# Patient Record
Sex: Female | Born: 1995
Health system: Southern US, Community
[De-identification: ages and names within clinical notes are randomized; demographics above are authoritative.]

## PROBLEM LIST (undated history)

## (undated) DIAGNOSIS — F329 Major depressive disorder, single episode, unspecified: Secondary | ICD-10-CM

## (undated) DIAGNOSIS — E119 Type 2 diabetes mellitus without complications: Secondary | ICD-10-CM

## (undated) DIAGNOSIS — F32A Depression, unspecified: Secondary | ICD-10-CM

## (undated) HISTORY — PX: NO PAST SURGERIES: SHX2092

---
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
## 2015-04-25 ENCOUNTER — Encounter: Payer: Self-pay | Admitting: Emergency Medicine

## 2015-04-25 ENCOUNTER — Ambulatory Visit
Admission: EM | Admit: 2015-04-25 | Discharge: 2015-04-25 | Disposition: A | Discharge status: Home / Self Care | Payer: BLUE CROSS/BLUE SHIELD | Attending: Family Medicine | Admitting: Family Medicine

## 2015-04-25 DIAGNOSIS — J029 Acute pharyngitis, unspecified: Secondary | ICD-10-CM | POA: Diagnosis not present

## 2015-04-25 DIAGNOSIS — H6692 Otitis media, unspecified, left ear: Secondary | ICD-10-CM | POA: Diagnosis not present

## 2015-04-25 HISTORY — DX: Major depressive disorder, single episode, unspecified: F32.9

## 2015-04-25 HISTORY — DX: Depression, unspecified: F32.A

## 2015-04-25 LAB — CBC WITH DIFFERENTIAL/PLATELET
Basophils Absolute: 0.1 10*3/uL (ref 0–0.1)
Basophils Relative: 0 %
EOS ABS: 0.6 10*3/uL (ref 0–0.7)
Eosinophils Relative: 4 %
HCT: 42.2 % (ref 35.0–47.0)
HEMOGLOBIN: 14.4 g/dL (ref 12.0–16.0)
LYMPHS ABS: 1.7 10*3/uL (ref 1.0–3.6)
Lymphocytes Relative: 12 %
MCH: 29.3 pg (ref 26.0–34.0)
MCHC: 34.2 g/dL (ref 32.0–36.0)
MCV: 85.6 fL (ref 80.0–100.0)
Monocytes Absolute: 0.9 10*3/uL (ref 0.2–0.9)
Monocytes Relative: 6 %
NEUTROS PCT: 78 %
Neutro Abs: 11.1 10*3/uL — ABNORMAL HIGH (ref 1.4–6.5)
Platelets: 298 10*3/uL (ref 150–440)
RBC: 4.92 MIL/uL (ref 3.80–5.20)
RDW: 12.7 % (ref 11.5–14.5)
WBC: 14.4 10*3/uL — ABNORMAL HIGH (ref 3.6–11.0)

## 2015-04-25 LAB — MONONUCLEOSIS SCREEN: Mono Screen: NEGATIVE

## 2015-04-25 LAB — RAPID STREP SCREEN (MED CTR MEBANE ONLY): Streptococcus, Group A Screen (Direct): NEGATIVE

## 2015-04-25 MED ORDER — AZITHROMYCIN 250 MG PO TABS: ORAL_TABLET | ORAL | Status: DC

## 2015-04-25 NOTE — Discharge Instructions (Signed)
Pharyngitis Pharyngitis is redness, pain, and swelling (inflammation) of your pharynx.  CAUSES  Pharyngitis is usually caused by infection. Most of the time, these infections are from viruses (viral) and are part of a cold. However, sometimes pharyngitis is caused by bacteria (bacterial). Pharyngitis can also be caused by allergies. Viral pharyngitis may be spread from person to person by coughing, sneezing, and personal items or utensils (cups, forks, spoons, toothbrushes). Bacterial pharyngitis may be spread from person to person by more intimate contact, such as kissing.  SIGNS AND SYMPTOMS  Symptoms of pharyngitis include:   Sore throat.   Tiredness (fatigue).   Low-grade fever.   Headache.  Joint pain and muscle aches.  Skin rashes.  Swollen lymph nodes.  Plaque-like film on throat or tonsils (often seen with bacterial pharyngitis). DIAGNOSIS  Your health care provider will ask you questions about your illness and your symptoms. Your medical history, along with a physical exam, is often all that is needed to diagnose pharyngitis. Sometimes, a rapid strep test is done. Other lab tests may also be done, depending on the suspected cause.  TREATMENT  Viral pharyngitis will usually get better in 3-4 days without the use of medicine. Bacterial pharyngitis is treated with medicines that kill germs (antibiotics).  HOME CARE INSTRUCTIONS   Drink enough water and fluids to keep your urine clear or pale yellow.   Only take over-the-counter or prescription medicines as directed by your health care provider:   If you are prescribed antibiotics, make sure you finish them even if you start to feel better.   Do not take aspirin.   Get lots of rest.   Gargle with 8 oz of salt water ( tsp of salt per 1 qt of water) as often as every 1-2 hours to soothe your throat.   Throat lozenges (if you are not at risk for choking) or sprays may be used to soothe your throat. SEEK MEDICAL  CARE IF:   You have large, tender lumps in your neck.  You have a rash.  You cough up green, yellow-brown, or bloody spit. SEEK IMMEDIATE MEDICAL CARE IF:   Your neck becomes stiff.  You drool or are unable to swallow liquids.  You vomit or are unable to keep medicines or liquids down.  You have severe pain that does not go away with the use of recommended medicines.  You have trouble breathing (not caused by a stuffy nose). MAKE SURE YOU:   Understand these instructions.  Will watch your condition.  Will get help right away if you are not doing well or get worse.   This information is not intended to replace advice given to you by your health care provider. Make sure you discuss any questions you have with your health care provider.   Document Released: 04/13/2005 Document Revised: 02/01/2013 Document Reviewed: 12/19/2012 Elsevier Interactive Patient Education 2016 [employer]  Otitis Media, Adult Otitis media is redness, soreness, and puffiness (swelling) in the space just behind your eardrum (middle ear). It may be caused by allergies or infection. It often happens along with a cold. HOME CARE  Take your medicine as told. Finish it even if you start to feel better.  Only take over-the-counter or prescription medicines for pain, discomfort, or fever as told by your doctor.  Follow up with your doctor as told. GET HELP IF:  You have otitis media only in one ear, or bleeding from your nose, or both.  You notice a lump on your neck.  You are not getting better in 3-5 days.  You feel worse instead of better. GET HELP RIGHT AWAY IF:   You have pain that is not helped with medicine.  You have puffiness, redness, or pain around your ear.  You get a stiff neck.  You cannot move part of your face (paralysis).  You notice that the bone behind your ear hurts when you touch it. MAKE SURE YOU:   Understand these instructions.  Will watch your condition.  Will  get help right away if you are not doing well or get worse.   This information is not intended to replace advice given to you by your health care provider. Make sure you discuss any questions you have with your health care provider.   Document Released: 09/30/2007 Document Revised: 05/04/2014 Document Reviewed: 11/08/2012 Elsevier Interactive Patient Education [employer]

## 2015-04-25 NOTE — ED Notes (Signed)
Patient c/o sore throat and ear pain that has not improved.  Patient's last dose of Penicillin is today.

## 2015-04-25 NOTE — ED Provider Notes (Signed)
[facility]  ____________________________________________  Time seen: Approximately 10:00 AM  I have reviewed the triage vital signs and the nursing notes.   HISTORY  Chief Complaint Otalgia and Sore Throat    HPI Lynford HumphreyDanica Alameda is a 19 y.o. female presents with complaint of sore throat. Patient reports that last week she was seen by her primary care doctor and was diagnosed with strep throat with a positive quick strep. Patient reports a that time she was given a prescription for oral penicillin. Patient reports that she took her last penicillin dose this morning. Patient presented last 2-3 days she has began having increasing sore throat even loss of taking medications as prescribed. Patient does also report some runny nose and congestion which is described as mild. Patient also reports left ear pain which is described as an aching throbbing pain at 4 out of 10.  Patient reports that this past Sunday she was around her family and reports that the baby that she was around had strep throat.  Patient reports that for the last 2 days her fever also has returned. Reports that she had a fever last night of 102 and had a fever of 101 this morning. Patient reports that she has taken ibuprofen today.  Denies other sick exposures.  Denies chest pain, shortness of breath, cough, abdominal pain, nausea, vomiting, weakness, dizziness, neck or back pain, rash. Reports sore throat does hurt to swallow however reports continues to eat and drink well.   Past Medical History  Diagnosis Date  . Depression     There are no active problems to display for this patient.  Last menstrual: Was in October. Patient reports that she is on a 3 month birth control regimen (12 weeks pill, then menstrual x one week) Reports she is not sexually active at this time.  History reviewed. No pertinent past surgical history.  Current Outpatient Rx  Name  Route  Sig  Dispense  Refill  . escitalopram  (LEXAPRO) 5 MG tablet   Oral   Take 15 mg by mouth daily.         . norethindrone-ethinyl estradiol-iron (ESTROSTEP FE,TILIA FE,TRI-LEGEST FE) 1-20/1-30/1-35 MG-MCG tablet   Oral   Take 1 tablet by mouth daily.           Allergies Review of patient's allergies indicates no known allergies.   family history Mother: depression  Social History Social History  Substance Use Topics  . Smoking status: Current Some Day Smoker    Types: Cigarettes  . Smokeless tobacco: None  . Alcohol Use: No    Review of Systems Constitutional:  positive subjective fevers. Eyes: No visual changes. ENT:  positive sore throat. Positive runny nose and nasal congestion. Positive left ear pain. ardiovascular: Denies chest pain. Respiratory: Denies shortness of breath. Gastrointestinal: No abdominal pain.  No nausea, no vomiting.  No diarrhea.  No constipation. Genitourinary: Negative for dysuria. Musculoskeletal: Negative for back pain. Skin: Negative for rash. Neurological: Negative for headaches, focal weakness or numbness.  10-point ROS otherwise negative.  ____________________________________________   PHYSICAL EXAM:  VITAL SIGNS: ED Triage Vitals  Enc Vitals Group     BP 04/25/15 0914 129/84 mmHg     Pulse Rate 04/25/15 0914 106 Recheck 94     Resp 04/25/15 0914 16     Temp 04/25/15 0914 97.7 F (36.5 C)     Temp Source 04/25/15 0914 Tympanic     SpO2 04/25/15 0914 100 %     Weight 04/25/15 0914  215 lb (97.523 kg)     Height 04/25/15 0914  (1.676 m)     Head Cir --      Peak Flow --      Pain Score 04/25/15 0918 7     Pain Loc --      Pain Edu? --      Excl. in GC? --     Constitutional: Alert and oriented. Well appearing and in no acute distress. Eyes: Conjunctivae are normal. PERRL. EOMI. Head: Atraumatic. no tenderness to palpation. No swelling. No TMJ tenderness.   Ears:  Left: Moderate erythema, mild tenderness to palpation with auricle movement, TM dullness,  no exudate or drainage. Right: No erythema, normal TM.  Nose:  mild nasal congestion and mild clear rhinorrhea.  Mouth/Throat: Mucous membranes are moist.   Moderate pharyngeal erythema with 2+ bilateral tonsillar swelling. No exudate. No uvular shift or deviation. Neck: No stridor.  No cervical spine tenderness to palpation. Hematological/Lymphatic/Immunilogical:  mild anterior  cervical lymphadenopathy. Cardiovascular: Normal rate, regular rhythm. Grossly normal heart sounds.  Good peripheral circulation. Respiratory: Normal respiratory effort.  No retractions. Lungs CTAB. Gastrointestinal: Soft and nontender. . No hepatomegaly or splenomegaly palpated. Musculoskeletal: No lower or upper extremity tenderness nor edema.  No joint effusions. Bilateral pedal pulses equal and easily palpated.  Neurologic:  Normal speech and language. No gross focal neurologic deficits are appreciated. No gait instability. Skin:  Skin is warm, dry and intact. No rash noted. Psychiatric: Mood and affect are normal. Speech and behavior are normal.  ____________________________________________   LABS (all labs ordered are listed, but only abnormal results are displayed)  Labs Reviewed  CBC WITH DIFFERENTIAL/PLATELET - Abnormal; Notable for the following:    WBC 14.4 (*)    Neutro Abs 11.1 (*)    All other components within normal limits  RAPID STREP SCREEN (NOT AT [facility])  CULTURE, GROUP A STREP ([facility] ONLY)  MONONUCLEOSIS SCREEN     INITIAL IMPRESSION / ASSESSMENT AND PLAN / ED COURSE  Pertinent labs & imaging results that were available during my care of the patient were reviewed by me and considered in my medical decision making (see chart for details).  Very well-appearing patient. No acute distress. Presents for the complaint of sore throat and left ear pain. Patient reports diagnosis strep throat at PCP last week and that she was treated with penicillin. Reports last dose of penicillin was this  morning. Patient presented last 2-3 days sore throat has increased as well as fever has returned. Reports left ear pain times few days. Also reports some accompanying runny nose and nasal congestion. Moderate pharyngeal erythema and tonsillar swelling. Left otitis media. Lungs clear throughout. Abdomen soft and nontender. Will evaluate strep swab as well as evaluate for mono. Concern for repeat strep throat as patient has been back around family members that also positive strep throat.  Labs reviewed. Quick strep negative, will culture. Monitor negative. WBC 14.4. Suspect streptococcal pharyngitis has recently reexposed by family member as well as left otitis media. As patient just taking penicillin will treat with azithromycin. Encouraged rest, fluids, Tylenol or ibuprofen over the counter as needed, rest and PCP follow up.  Discussed follow up with Primary care physician this week. Discussed follow up and return parameters including no resolution or any worsening concerns. Patient verbalized understanding and agreed to plan.   ____________________________________________   FINAL CLINICAL IMPRESSION(S) / ED DIAGNOSES  Final diagnoses:  Pharyngitis  Acute left otitis media, recurrence not specified, unspecified otitis  media type       Renford Dills, NP 04/25/15 1119

## 2015-04-27 LAB — CULTURE, GROUP A STREP (THRC)

## 2019-12-13 DIAGNOSIS — Z0000 Encounter for general adult medical examination without abnormal findings: Secondary | ICD-10-CM | POA: Diagnosis not present

## 2019-12-13 DIAGNOSIS — E559 Vitamin D deficiency, unspecified: Secondary | ICD-10-CM | POA: Diagnosis not present

## 2019-12-13 DIAGNOSIS — Z1159 Encounter for screening for other viral diseases: Secondary | ICD-10-CM | POA: Diagnosis not present

## 2019-12-13 DIAGNOSIS — Z131 Encounter for screening for diabetes mellitus: Secondary | ICD-10-CM | POA: Diagnosis not present

## 2019-12-13 DIAGNOSIS — E538 Deficiency of other specified B group vitamins: Secondary | ICD-10-CM | POA: Diagnosis not present

## 2019-12-13 DIAGNOSIS — N979 Female infertility, unspecified: Secondary | ICD-10-CM | POA: Diagnosis not present

## 2019-12-13 DIAGNOSIS — Z7689 Persons encountering health services in other specified circumstances: Secondary | ICD-10-CM | POA: Diagnosis not present

## 2019-12-13 DIAGNOSIS — Z13220 Encounter for screening for lipoid disorders: Secondary | ICD-10-CM | POA: Diagnosis not present

## 2019-12-13 DIAGNOSIS — Z114 Encounter for screening for human immunodeficiency virus [HIV]: Secondary | ICD-10-CM | POA: Diagnosis not present

## 2019-12-13 DIAGNOSIS — Z23 Encounter for immunization: Secondary | ICD-10-CM | POA: Diagnosis not present

## 2019-12-13 DIAGNOSIS — Z1329 Encounter for screening for other suspected endocrine disorder: Secondary | ICD-10-CM | POA: Diagnosis not present

## 2019-12-13 DIAGNOSIS — R69 Illness, unspecified: Secondary | ICD-10-CM | POA: Diagnosis not present

## 2020-01-03 DIAGNOSIS — R69 Illness, unspecified: Secondary | ICD-10-CM | POA: Diagnosis not present

## 2020-01-03 DIAGNOSIS — N913 Primary oligomenorrhea: Secondary | ICD-10-CM | POA: Diagnosis not present

## 2020-01-03 DIAGNOSIS — N979 Female infertility, unspecified: Secondary | ICD-10-CM | POA: Diagnosis not present

## 2020-01-03 DIAGNOSIS — Z113 Encounter for screening for infections with a predominantly sexual mode of transmission: Secondary | ICD-10-CM | POA: Diagnosis not present

## 2020-01-04 DIAGNOSIS — Z6841 Body Mass Index (BMI) 40.0 and over, adult: Secondary | ICD-10-CM | POA: Diagnosis not present

## 2020-01-04 DIAGNOSIS — N979 Female infertility, unspecified: Secondary | ICD-10-CM | POA: Diagnosis not present

## 2020-01-24 DIAGNOSIS — R69 Illness, unspecified: Secondary | ICD-10-CM | POA: Diagnosis not present

## 2020-02-01 DIAGNOSIS — N979 Female infertility, unspecified: Secondary | ICD-10-CM | POA: Diagnosis not present

## 2020-02-15 DIAGNOSIS — N979 Female infertility, unspecified: Secondary | ICD-10-CM | POA: Diagnosis not present

## 2020-02-16 DIAGNOSIS — Z6841 Body Mass Index (BMI) 40.0 and over, adult: Secondary | ICD-10-CM | POA: Diagnosis not present

## 2020-03-01 ENCOUNTER — Other Ambulatory Visit: Payer: Self-pay

## 2020-03-01 ENCOUNTER — Ambulatory Visit
Admission: EM | Admit: 2020-03-01 | Discharge: 2020-03-01 | Disposition: A | Discharge status: Home / Self Care | Payer: 59 | Attending: Emergency Medicine | Admitting: Emergency Medicine

## 2020-03-01 DIAGNOSIS — J039 Acute tonsillitis, unspecified: Secondary | ICD-10-CM

## 2020-03-01 HISTORY — DX: Type 2 diabetes mellitus without complications: E11.9

## 2020-03-01 LAB — GROUP A STREP BY PCR: Group A Strep by PCR: NOT DETECTED

## 2020-03-01 MED ORDER — AMOXICILLIN-POT CLAVULANATE 875-125 MG PO TABS: 1.0000 | ORAL_TABLET | Freq: Two times a day (BID) | ORAL | 0 refills | Status: DC

## 2020-03-01 NOTE — ED Triage Notes (Signed)
Patient complains of fever, cough,, nasal congestion and sore throat x Monday.

## 2020-03-01 NOTE — Discharge Instructions (Signed)
Take the Augmentin twice daily with food for 7 days.  Gargle with warm salt water 2-3 times a day.  Put 1 tablespoon of table salt in 8 ounces of warm water gargle and spit.  Take an over-the-counter probiotic 1 hour after each dose of antibiotic, such as Culturelle, to prevent diarrhea or yeast infection.  If your symptoms not improve follow-up with your primary care provider.

## 2020-03-01 NOTE — ED Provider Notes (Signed)
[facility]    CSN: [id number] Arrival date & time: 03/01/20  1013      History   Chief Complaint Chief Complaint  Patient presents with  . Fever  . Sore Throat    HPI Victoria Sutton is a 24 y.o. female.   24 year old female here for evaluation of fever, cough, nasal congestion, and sore throat.  Patient reports that her symptoms started 4 days ago.  She has had a max temperature at home of 101.  She does report that she had a coworker test positive for Covid 2 weeks ago but she was not around them and when at work she wears a mask.  She denies any ear pain or pressure just says that they pop when she yawns.  Patient denies body aches, changes to taste or smell, nausea, vomiting, or diarrhea.  She is had no pain or pressure in her sinuses.     Past Medical History:  Diagnosis Date  . Depression   . Diabetes mellitus without complication (HCC)     There are no problems to display for this patient.   Past Surgical History:  Procedure Laterality Date  . NO PAST SURGERIES      OB History   No obstetric history on file.      Home Medications    Prior to Admission medications   Medication Sig Start Date End Date Taking? Authorizing Provider  metFORMIN (GLUCOPHAGE) 500 MG tablet Take by mouth. 02/05/20  Yes [provider]  phentermine (ADIPEX-P) 37.5 MG tablet Take 37.5 mg by mouth at bedtime. 02/17/20  Yes [provider]  traZODone (DESYREL) 50 MG tablet Take 50 mg by mouth at bedtime. 02/16/20  Yes [provider]  TRINTELLIX 20 MG TABS tablet Take 20 mg by mouth daily. 02/25/20  Yes [provider]  amoxicillin-clavulanate (AUGMENTIN) 875-125 MG tablet Take 1 tablet by mouth every 12 (twelve) hours. 03/01/20   Becky Augusta, NP  escitalopram (LEXAPRO) 5 MG tablet Take 15 mg by mouth daily.  03/01/20  [provider]  norethindrone-ethinyl estradiol-iron (ESTROSTEP FE,TILIA FE,TRI-LEGEST FE) 1-20/1-30/1-35  MG-MCG tablet Take 1 tablet by mouth daily.  03/01/20  [provider]    Family History Family History  Problem Relation Age of Onset  . Diabetes Mother   . Hypertension Mother   . Diabetes Father   . Hyperlipidemia Father   . Hypertension Father     Social History Social History   Tobacco Use  . Smoking status: Current Some Day Smoker    Types: Cigarettes  . Smokeless tobacco: Never Used  Vaping Use  . Vaping Use: Never used  Substance Use Topics  . Alcohol use: No  . Drug use: No     Allergies   Patient has no known allergies.   Review of Systems Review of Systems  Constitutional: Positive for fever. Negative for activity change.  HENT: Positive for congestion and sore throat. Negative for ear discharge, ear pain, sinus pressure and sinus pain.   Respiratory: Positive for cough. Negative for shortness of breath and wheezing.   Cardiovascular: Negative for chest pain.  Gastrointestinal: Negative for diarrhea, nausea and vomiting.  Genitourinary: Negative for frequency and urgency.  Musculoskeletal: Negative for arthralgias and myalgias.  Skin: Negative for rash.  Neurological: Negative for headaches.  Hematological: Negative.   Psychiatric/Behavioral: Negative.      Physical Exam Triage Vital Signs ED Triage Vitals  Enc Vitals Group     BP 03/01/20 1035 123/73  Pulse Rate 03/01/20 1035 80     Resp 03/01/20 1035 17     Temp 03/01/20 1035 98.2 F (36.8 C)     Temp Source 03/01/20 1035 Oral     SpO2 03/01/20 1035 100 %     Weight 03/01/20 1032 277 lb (125.6 kg)     Height 03/01/20 1032 5\' 5"  (1.651 m)     Head Circumference --      Peak Flow --      Pain Score 03/01/20 1032 4     Pain Loc --      Pain Edu? --      Excl. in GC? --    No data found.  Updated Vital Signs BP 123/73 (BP Location: Left Arm)   Pulse 80   Temp 98.2 F (36.8 C) (Oral)   Resp 17   Ht 5\' 5"  (1.651 m)   Wt 277 lb (125.6 kg)   LMP 02/05/2020   SpO2 100%    BMI 46.10 kg/m   Visual Acuity Right Eye Distance:   Left Eye Distance:   Bilateral Distance:    Right Eye Near:   Left Eye Near:    Bilateral Near:     Physical Exam Vitals and nursing note reviewed.  Constitutional:      General: She is not in acute distress.    Appearance: She is well-developed. She is not toxic-appearing.  HENT:     Head: Normocephalic and atraumatic.     Right Ear: Tympanic membrane and ear canal normal. No middle ear effusion. Tympanic membrane is not erythematous.     Left Ear: Tympanic membrane and ear canal normal.  No middle ear effusion. Tympanic membrane is not erythematous.     Nose: Congestion and rhinorrhea present.     Comments: Nasal mucosa is mildly erythematous and edematous with clear nasal discharge.  No tenderness to percussion to either the frontal or maxillary sinuses.    Mouth/Throat:     Mouth: Mucous membranes are moist.     Tonsils: Tonsillar exudate present. 2+ on the right. 2+ on the left.     Comments: There is a strep odor on the patient's breath.  Tonsillar pillars are erythematous and edematous with white exudate bilaterally.  Unable to visualize the posterior oropharynx. Eyes:     Extraocular Movements:     Right eye: Normal extraocular motion.     Left eye: Normal extraocular motion.     Conjunctiva/sclera: Conjunctivae normal.     Pupils: Pupils are equal, round, and reactive to light.  Neck:     Comments: Patient has bilateral shotty, nontender, anterior cervical lymphadenopathy. Cardiovascular:     Rate and Rhythm: Normal rate and regular rhythm.     Heart sounds: Normal heart sounds. No murmur heard.  No gallop.   Pulmonary:     Effort: Pulmonary effort is normal.     Breath sounds: Normal breath sounds. No wheezing, rhonchi or rales.  Musculoskeletal:     Cervical back: Normal range of motion and neck supple.  Lymphadenopathy:     Cervical: Cervical adenopathy present.  Skin:    General: Skin is warm and dry.       Capillary Refill: Capillary refill takes less than 2 seconds.     Findings: No erythema or rash.  Neurological:     General: No focal deficit present.     Mental Status: She is alert and oriented to person, place, and time.  Psychiatric:  Mood and Affect: Mood normal.        Behavior: Behavior normal.      UC Treatments / Results  Labs (all labs ordered are listed, but only abnormal results are displayed) Labs Reviewed  GROUP A STREP BY PCR    EKG   Radiology No results found.  Procedures Procedures (including critical care time)  Medications Ordered in UC Medications - No data to display  Initial Impression / Assessment and Plan / UC Course  I have reviewed the triage vital signs and the nursing notes.  Pertinent labs & imaging results that were available during my care of the patient were reviewed by me and considered in my medical decision making (see chart for details).   Patient is here for evaluation of a sore throat and associated cold symptoms including fever and cough.  Patient does have some nasal congestion with swollen and red nasal mucous membranes.  Clear discharge in both nares.  Patient's throat has exudative tonsillitis on both sides.  And there is a strep-like odor on her breath.  Shotty changes of anterior cervical lymphadenopathy are present bilaterally.  Patient also complaining of a cough but her lungs are clear to auscultation.  Suspect postnasal drip might be contributing to the cough but unable to visualize posterior oropharynx due to the tonsillar edema.  Will check strep PCR.  PCR is negative.  However, based upon exam findings will treat patient for exudative tonsillitis with Augmentin twice daily for 7 days.   Final Clinical Impressions(s) / UC Diagnoses   Final diagnoses:  Exudative tonsillitis     Discharge Instructions     Take the Augmentin twice daily with food for 7 days.  Gargle with warm salt water 2-3 times a day.   Put 1 tablespoon of table salt in 8 ounces of warm water gargle and spit.  Take an over-the-counter probiotic 1 hour after each dose of antibiotic, such as Culturelle, to prevent diarrhea or yeast infection.  If your symptoms not improve follow-up with your primary care provider.    ED Prescriptions    Medication Sig Dispense Auth. Provider   amoxicillin-clavulanate (AUGMENTIN) 875-125 MG tablet Take 1 tablet by mouth every 12 (twelve) hours. 14 tablet Becky Augusta, NP     PDMP not reviewed this encounter.   Becky Augusta, NP 03/01/20 [phone]

## 2020-05-09 DIAGNOSIS — Z6841 Body Mass Index (BMI) 40.0 and over, adult: Secondary | ICD-10-CM | POA: Diagnosis not present

## 2020-06-06 DIAGNOSIS — Z6841 Body Mass Index (BMI) 40.0 and over, adult: Secondary | ICD-10-CM | POA: Diagnosis not present

## 2020-07-30 DIAGNOSIS — Z6841 Body Mass Index (BMI) 40.0 and over, adult: Secondary | ICD-10-CM | POA: Diagnosis not present

## 2020-09-03 DIAGNOSIS — Z6841 Body Mass Index (BMI) 40.0 and over, adult: Secondary | ICD-10-CM | POA: Diagnosis not present

## 2020-10-08 DIAGNOSIS — R69 Illness, unspecified: Secondary | ICD-10-CM | POA: Diagnosis not present

## 2020-10-08 DIAGNOSIS — E538 Deficiency of other specified B group vitamins: Secondary | ICD-10-CM | POA: Diagnosis not present

## 2020-10-08 DIAGNOSIS — E559 Vitamin D deficiency, unspecified: Secondary | ICD-10-CM | POA: Diagnosis not present

## 2020-10-22 DIAGNOSIS — R69 Illness, unspecified: Secondary | ICD-10-CM | POA: Diagnosis not present

## 2020-11-05 DIAGNOSIS — R69 Illness, unspecified: Secondary | ICD-10-CM | POA: Diagnosis not present

## 2020-11-06 ENCOUNTER — Ambulatory Visit
Admission: EM | Admit: 2020-11-06 | Discharge: 2020-11-06 | Disposition: A | Discharge status: Home / Self Care | Payer: 59 | Attending: Family Medicine | Admitting: Family Medicine

## 2020-11-06 ENCOUNTER — Other Ambulatory Visit: Payer: Self-pay

## 2020-11-06 DIAGNOSIS — Z7984 Long term (current) use of oral hypoglycemic drugs: Secondary | ICD-10-CM | POA: Diagnosis not present

## 2020-11-06 DIAGNOSIS — R509 Fever, unspecified: Secondary | ICD-10-CM | POA: Insufficient documentation

## 2020-11-06 DIAGNOSIS — J029 Acute pharyngitis, unspecified: Secondary | ICD-10-CM

## 2020-11-06 DIAGNOSIS — Z833 Family history of diabetes mellitus: Secondary | ICD-10-CM | POA: Diagnosis not present

## 2020-11-06 DIAGNOSIS — Z20822 Contact with and (suspected) exposure to covid-19: Secondary | ICD-10-CM | POA: Insufficient documentation

## 2020-11-06 DIAGNOSIS — Z79899 Other long term (current) drug therapy: Secondary | ICD-10-CM | POA: Diagnosis not present

## 2020-11-06 DIAGNOSIS — F17210 Nicotine dependence, cigarettes, uncomplicated: Secondary | ICD-10-CM | POA: Diagnosis not present

## 2020-11-06 DIAGNOSIS — R5383 Other fatigue: Secondary | ICD-10-CM | POA: Diagnosis not present

## 2020-11-06 DIAGNOSIS — E119 Type 2 diabetes mellitus without complications: Secondary | ICD-10-CM | POA: Insufficient documentation

## 2020-11-06 DIAGNOSIS — Z28310 Unvaccinated for covid-19: Secondary | ICD-10-CM | POA: Diagnosis not present

## 2020-11-06 DIAGNOSIS — R69 Illness, unspecified: Secondary | ICD-10-CM | POA: Diagnosis not present

## 2020-11-06 LAB — GROUP A STREP BY PCR: Group A Strep by PCR: NOT DETECTED

## 2020-11-06 MED ORDER — LIDOCAINE VISCOUS HCL 2 % MT SOLN: 15.0000 mL | OROMUCOSAL | 0 refills | Status: AC | PRN

## 2020-11-06 NOTE — Discharge Instructions (Addendum)

## 2020-11-06 NOTE — ED Provider Notes (Signed)
[facility]    CSN: [id number] Arrival date & time: 11/06/20  1317      History   Chief Complaint Chief Complaint  Patient presents with   Fever    HPI Victoria Sutton is a 25 y.o. female presenting for an onset of sore throat, painful swallowing and temperature of 100.7 degrees yesterday.  Just admits to fatigue.  Patient's temperature is currently 99.2 degrees.  She has not been taking any medication for symptoms.  She denies any associated body aches, cough, congestion, sinus pain, chest pain, shortness of breath, nausea/vomiting or diarrhea.  No sick contacts and no known exposure to COVID-19.  Has not been vaccinated for COVID-19.  She does have personal history of diabetes.  No other complaints.  HPI  Past Medical History:  Diagnosis Date   Depression    Diabetes mellitus without complication (HCC)     There are no problems to display for this patient.   Past Surgical History:  Procedure Laterality Date   NO PAST SURGERIES      OB History   No obstetric history on file.      Home Medications    Prior to Admission medications   Medication Sig Start Date End Date Taking? Authorizing Provider  lidocaine (XYLOCAINE) 2 % solution Use as directed 15 mLs in the mouth or throat every 3 (three) hours as needed for up to 5 days for mouth pain (swish and spit). 11/06/20 11/11/20 Yes Shirlee Latch, PA-C  metFORMIN (GLUCOPHAGE) 500 MG tablet Take by mouth. 02/05/20   [provider]  phentermine (ADIPEX-P) 37.5 MG tablet Take 37.5 mg by mouth at bedtime. 02/17/20   [provider]  traZODone (DESYREL) 50 MG tablet Take 50 mg by mouth at bedtime. 02/16/20   [provider]  TRINTELLIX 20 MG TABS tablet Take 20 mg by mouth daily. 02/25/20   [provider]  escitalopram (LEXAPRO) 5 MG tablet Take 15 mg by mouth daily.  03/01/20  [provider]  norethindrone-ethinyl estradiol-iron (ESTROSTEP FE,TILIA FE,TRI-LEGEST FE)  1-20/1-30/1-35 MG-MCG tablet Take 1 tablet by mouth daily.  03/01/20  [provider]    Family History Family History  Problem Relation Age of Onset   Diabetes Mother    Hypertension Mother    Diabetes Father    Hyperlipidemia Father    Hypertension Father     Social History Social History   Tobacco Use   Smoking status: Some Days    Pack years: 0.00    Types: Cigarettes   Smokeless tobacco: Never  Vaping Use   Vaping Use: Never used  Substance Use Topics   Alcohol use: No   Drug use: No     Allergies   Patient has no known allergies.   Review of Systems Review of Systems  Constitutional:  Positive for fatigue and fever. Negative for chills and diaphoresis.  HENT:  Positive for sore throat. Negative for congestion, ear pain, rhinorrhea, sinus pressure and sinus pain.   Respiratory:  Negative for cough and shortness of breath.   Gastrointestinal:  Negative for abdominal pain, nausea and vomiting.  Musculoskeletal:  Negative for arthralgias and myalgias.  Skin:  Negative for rash.  Neurological:  Negative for weakness and headaches.  Hematological:  Negative for adenopathy.    Physical Exam Triage Vital Signs ED Triage Vitals  Enc Vitals Group     BP 11/06/20 1332 123/84     Pulse Rate 11/06/20 1332 (!) 111  Resp 11/06/20 1332 16     Temp 11/06/20 1332 99.2 F (37.3 C)     Temp Source 11/06/20 1332 Oral     SpO2 11/06/20 1332 98 %     Weight 11/06/20 1333 240 lb (108.9 kg)     Height 11/06/20 1333 5\' 5"  (1.651 m)     Head Circumference --      Peak Flow --      Pain Score 11/06/20 1333 6     Pain Loc --      Pain Edu? --      Excl. in GC? --    No data found.  Updated Vital Signs BP 123/84   Pulse (!) 111   Temp 99.2 F (37.3 C) (Oral)   Resp 16   Ht 5\' 5"  (1.651 m)   Wt 240 lb (108.9 kg)   LMP 11/02/2020 (Approximate)   SpO2 98%   BMI 39.94 kg/m       Physical Exam Vitals and nursing note reviewed.  Constitutional:       General: She is not in acute distress.    Appearance: Normal appearance. She is not ill-appearing or toxic-appearing.  HENT:     Head: Normocephalic and atraumatic.     Nose: Nose normal.     Mouth/Throat:     Mouth: Mucous membranes are moist.     Pharynx: Oropharynx is clear. Posterior oropharyngeal erythema present.     Tonsils: 1+ on the right. 1+ on the left.  Eyes:     General: No scleral icterus.       Right eye: No discharge.        Left eye: No discharge.     Conjunctiva/sclera: Conjunctivae normal.  Cardiovascular:     Rate and Rhythm: Normal rate and regular rhythm.     Heart sounds: Normal heart sounds.  Pulmonary:     Effort: Pulmonary effort is normal. No respiratory distress.     Breath sounds: Normal breath sounds.  Musculoskeletal:     Cervical back: Neck supple.  Lymphadenopathy:     Cervical: Cervical adenopathy present.  Skin:    General: Skin is dry.  Neurological:     General: No focal deficit present.     Mental Status: She is alert. Mental status is at baseline.     Motor: No weakness.     Gait: Gait normal.  Psychiatric:        Mood and Affect: Mood normal.        Behavior: Behavior normal.        Thought Content: Thought content normal.     UC Treatments / Results  Labs (all labs ordered are listed, but only abnormal results are displayed) Labs Reviewed  GROUP A STREP BY PCR  SARS CORONAVIRUS 2 (TAT 6-24 HRS)    EKG   Radiology No results found.  Procedures Procedures (including critical care time)  Medications Ordered in UC Medications - No data to display  Initial Impression / Assessment and Plan / UC Course  I have reviewed the triage vital signs and the nursing notes.  Pertinent labs & imaging results that were available during my care of the patient were reviewed by me and considered in my medical decision making (see chart for details).  25 year old female presenting for sore throat, painful swallowing and fatigue as well  as low-grade temperature since yesterday.  Exam is significant for 1+ tonsillar swelling with erythema.  Bilateral enlarged anterior cervical lymph nodes.  The remainder  the exam is normal.  PCR strep test is negative.  PCR COVID test obtained.  Current CDC guidelines, isolation protocol and ED precautions reviewed with patient.  Advised patient she has viral tonsillitis/pharyngitis.  Supportive care encouraged with increasing rest and fluids.  Sent in viscous lidocaine.  Also advised ibuprofen and/or Tylenol.  Work note given.  Follow-up with [place] as needed for any worsening symptoms or if she is not feeling better next week.   Final Clinical Impressions(s) / UC Diagnoses   Final diagnoses:  Acute pharyngitis, unspecified etiology  Fatigue, unspecified type     Discharge Instructions      URI/COLD SYMPTOMS: Your exam today is consistent with a viral illness. Antibiotics are not indicated at this time. Use medications as directed, including cough syrup, nasal saline, and decongestants. Your symptoms should improve over the next few days and resolve within 7-10 days. Increase rest and fluids. F/u if symptoms worsen or predominate such as sore throat, ear pain, productive cough, shortness of breath, or if you develop high fevers or worsening fatigue over the next several days.    You have received COVID testing today either for positive exposure, concerning symptoms that could be related to COVID infection, screening purposes, or re-testing after confirmed positive.  Your test obtained today checks for active viral infection in the last 1-2 weeks. If your test is negative now, you can still test positive later. So, if you do develop symptoms you should either get re-tested and/or isolate x 5 days and then strict mask use x 5 days (unvaccinated) or mask use x 10 days (vaccinated). Please follow CDC guidelines.  While Rapid antigen tests come back in 15-20 minutes, send out PCR/molecular test  results typically come back within 1-3 days. In the mean time, if you are symptomatic, assume this could be a positive test and treat/monitor yourself as if you do have COVID.   We will call with test results if positive. Please download the MyChart app and set up a profile to access test results.   If symptomatic, go home and rest. Push fluids. Take Tylenol as needed for discomfort. Gargle warm salt water. Throat lozenges. Take Mucinex DM or Robitussin for cough. Humidifier in bedroom to ease coughing. Warm showers. Also review the COVID handout for more information.  COVID-19 INFECTION: The incubation period of COVID-19 is approximately 14 days after exposure, with most symptoms developing in roughly 4-5 days. Symptoms may range in severity from mild to critically severe. Roughly 80% of those infected will have mild symptoms. People of any age may become infected with COVID-19 and have the ability to transmit the virus. The most common symptoms include: fever, fatigue, cough, body aches, headaches, sore throat, nasal congestion, shortness of breath, nausea, vomiting, diarrhea, changes in smell and/or taste.    COURSE OF ILLNESS Some patients may begin with mild disease which can progress quickly into critical symptoms. If your symptoms are worsening please call ahead to the Emergency Department and proceed there for further treatment. Recovery time appears to be roughly 1-2 weeks for mild symptoms and 3-6 weeks for severe disease.   GO IMMEDIATELY TO ER FOR FEVER YOU ARE UNABLE TO GET DOWN WITH TYLENOL, BREATHING PROBLEMS, CHEST PAIN, FATIGUE, LETHARGY, INABILITY TO EAT OR DRINK, ETC  QUARANTINE AND ISOLATION: To help decrease the spread of COVID-19 please remain isolated if you have COVID infection or are highly suspected to have COVID infection. This means -stay home and isolate to one room in the home  if you live with others. Do not share a bed or bathroom with others while ill, sanitize and wipe  down all countertops and keep common areas clean and disinfected. Stay home for 5 days. If you have no symptoms or your symptoms are resolving after 5 days, you can leave your house. Continue to wear a mask around others for 5 additional days. If you have been in close contact (within 6 feet) of someone diagnosed with COVID 19, you are advised to quarantine in your home for 14 days as symptoms can develop anywhere from 2-14 days after exposure to the virus. If you develop symptoms, you  must isolate.  Most current guidelines for COVID after exposure -unvaccinated: isolate 5 days and strict mask use x 5 days. Test on day 5 is possible -vaccinated: wear mask x 10 days if symptoms do not develop -You do not necessarily need to be tested for COVID if you have + exposure and  develop symptoms. Just isolate at home x10 days from symptom onset During this global pandemic, CDC advises to practice social distancing, try to stay at least 76ft away from others at all times. Wear a face covering. Wash and sanitize your hands regularly and avoid going anywhere that is not necessary.  KEEP IN MIND THAT THE COVID TEST IS NOT 100% ACCURATE AND YOU SHOULD STILL DO EVERYTHING TO PREVENT POTENTIAL SPREAD OF VIRUS TO OTHERS (WEAR MASK, WEAR GLOVES, WASH HANDS AND SANITIZE REGULARLY). IF INITIAL TEST IS NEGATIVE, THIS MAY NOT MEAN YOU ARE DEFINITELY NEGATIVE. MOST ACCURATE TESTING IS DONE 5-7 DAYS AFTER EXPOSURE.   It is not advised by CDC to get re-tested after receiving a positive COVID test since you can still test positive for weeks to months after you have already cleared the virus.   *If you have not been vaccinated for COVID, I strongly suggest you consider getting vaccinated as long as there are no contraindications.       ED Prescriptions     Medication Sig Dispense Auth. Provider   lidocaine (XYLOCAINE) 2 % solution Use as directed 15 mLs in the mouth or throat every 3 (three) hours as needed for up to 5  days for mouth pain (swish and spit). 100 mL Shirlee Latch, PA-C      PDMP not reviewed this encounter.   Shirlee Latch, PA-C 11/06/20 1524

## 2020-11-06 NOTE — ED Triage Notes (Signed)
Pt reports having a fever (100.7) and sore throat that began yesterday. Did not take any fever reducing meds.

## 2020-11-07 LAB — SARS CORONAVIRUS 2 (TAT 6-24 HRS): SARS Coronavirus 2: NEGATIVE

## 2020-11-12 DIAGNOSIS — R69 Illness, unspecified: Secondary | ICD-10-CM | POA: Diagnosis not present

## 2020-11-19 DIAGNOSIS — R69 Illness, unspecified: Secondary | ICD-10-CM | POA: Diagnosis not present

## 2020-11-26 DIAGNOSIS — R69 Illness, unspecified: Secondary | ICD-10-CM | POA: Diagnosis not present

## 2020-11-28 DIAGNOSIS — L03311 Cellulitis of abdominal wall: Secondary | ICD-10-CM | POA: Diagnosis not present

## 2020-12-03 DIAGNOSIS — R69 Illness, unspecified: Secondary | ICD-10-CM | POA: Diagnosis not present

## 2020-12-10 DIAGNOSIS — R69 Illness, unspecified: Secondary | ICD-10-CM | POA: Diagnosis not present

## 2020-12-17 DIAGNOSIS — R69 Illness, unspecified: Secondary | ICD-10-CM | POA: Diagnosis not present

## 2020-12-31 DIAGNOSIS — R69 Illness, unspecified: Secondary | ICD-10-CM | POA: Diagnosis not present

## 2021-01-07 DIAGNOSIS — R69 Illness, unspecified: Secondary | ICD-10-CM | POA: Diagnosis not present

## 2021-01-21 DIAGNOSIS — R69 Illness, unspecified: Secondary | ICD-10-CM | POA: Diagnosis not present

## 2021-02-04 DIAGNOSIS — R69 Illness, unspecified: Secondary | ICD-10-CM | POA: Diagnosis not present

## 2021-02-13 DIAGNOSIS — R69 Illness, unspecified: Secondary | ICD-10-CM | POA: Diagnosis not present

## 2021-02-13 DIAGNOSIS — N912 Amenorrhea, unspecified: Secondary | ICD-10-CM | POA: Diagnosis not present

## 2021-02-13 DIAGNOSIS — N911 Secondary amenorrhea: Secondary | ICD-10-CM | POA: Diagnosis not present

## 2021-02-14 DIAGNOSIS — Z3403 Encounter for supervision of normal first pregnancy, third trimester: Secondary | ICD-10-CM | POA: Insufficient documentation

## 2021-02-20 DIAGNOSIS — R69 Illness, unspecified: Secondary | ICD-10-CM | POA: Diagnosis not present

## 2021-03-04 DIAGNOSIS — R69 Illness, unspecified: Secondary | ICD-10-CM | POA: Diagnosis not present

## 2021-03-18 DIAGNOSIS — R69 Illness, unspecified: Secondary | ICD-10-CM | POA: Diagnosis not present

## 2021-03-25 DIAGNOSIS — Z3401 Encounter for supervision of normal first pregnancy, first trimester: Secondary | ICD-10-CM | POA: Diagnosis not present

## 2021-03-25 DIAGNOSIS — Z6841 Body Mass Index (BMI) 40.0 and over, adult: Secondary | ICD-10-CM | POA: Diagnosis not present

## 2021-03-25 DIAGNOSIS — N898 Other specified noninflammatory disorders of vagina: Secondary | ICD-10-CM | POA: Diagnosis not present

## 2021-03-25 LAB — OB RESULTS CONSOLE ABO/RH: RH Type: NEGATIVE

## 2021-03-25 LAB — OB RESULTS CONSOLE RUBELLA ANTIBODY, IGM: Rubella: NON-IMMUNE/NOT IMMUNE

## 2021-03-25 LAB — OB RESULTS CONSOLE HEPATITIS B SURFACE ANTIGEN: Hepatitis B Surface Ag: NEGATIVE

## 2021-03-25 LAB — OB RESULTS CONSOLE VARICELLA ZOSTER ANTIBODY, IGG: Varicella: NON-IMMUNE/NOT IMMUNE

## 2021-03-25 LAB — OB RESULTS CONSOLE RPR: RPR: NONREACTIVE

## 2021-04-01 DIAGNOSIS — R69 Illness, unspecified: Secondary | ICD-10-CM | POA: Diagnosis not present

## 2021-04-07 DIAGNOSIS — Z3483 Encounter for supervision of other normal pregnancy, third trimester: Secondary | ICD-10-CM | POA: Diagnosis not present

## 2021-04-07 DIAGNOSIS — Z3482 Encounter for supervision of other normal pregnancy, second trimester: Secondary | ICD-10-CM | POA: Diagnosis not present

## 2021-04-08 DIAGNOSIS — Z3402 Encounter for supervision of normal first pregnancy, second trimester: Secondary | ICD-10-CM | POA: Diagnosis not present

## 2021-04-15 ENCOUNTER — Inpatient Hospital Stay: Admission: RE | Admit: 2021-04-15 | Payer: 59 | Source: Ambulatory Visit

## 2021-04-15 DIAGNOSIS — R69 Illness, unspecified: Secondary | ICD-10-CM | POA: Diagnosis not present

## 2021-04-22 DIAGNOSIS — Z23 Encounter for immunization: Secondary | ICD-10-CM | POA: Diagnosis not present

## 2021-05-06 DIAGNOSIS — R69 Illness, unspecified: Secondary | ICD-10-CM | POA: Diagnosis not present

## 2021-05-14 DIAGNOSIS — Z3402 Encounter for supervision of normal first pregnancy, second trimester: Secondary | ICD-10-CM | POA: Diagnosis not present

## 2021-05-20 DIAGNOSIS — R69 Illness, unspecified: Secondary | ICD-10-CM | POA: Diagnosis not present

## 2021-06-10 DIAGNOSIS — R69 Illness, unspecified: Secondary | ICD-10-CM | POA: Diagnosis not present

## 2021-06-18 DIAGNOSIS — O99213 Obesity complicating pregnancy, third trimester: Secondary | ICD-10-CM | POA: Insufficient documentation

## 2021-06-18 DIAGNOSIS — Z6791 Unspecified blood type, Rh negative: Secondary | ICD-10-CM | POA: Insufficient documentation

## 2021-06-18 DIAGNOSIS — O26892 Other specified pregnancy related conditions, second trimester: Secondary | ICD-10-CM | POA: Insufficient documentation

## 2021-07-01 ENCOUNTER — Other Ambulatory Visit
Admission: RE | Admit: 2021-07-01 | Discharge: 2021-07-01 | Disposition: A | Discharge status: Home / Self Care | Payer: 59 | Source: Ambulatory Visit | Attending: Anesthesiology | Admitting: Anesthesiology

## 2021-07-01 ENCOUNTER — Other Ambulatory Visit: Payer: Self-pay

## 2021-07-01 NOTE — Consult Note (Addendum)
Anesthesiology consult note ( Ambulatory referral to OB anesthesia for morbid obesity) : ? ?I had the distinct pleasure of meeting Victoria Sutton today for an OB anesthesia precheck.  She has a history of morbid obesity with a BMI of 42 today.  Her EDD is September 25 2021.  Patient reports no previous problems with anesthesia, no problems with her back and has a MP score of 3.  We discussed the risks of Obesity in Pregnancy including but not limited to: ?Patients with a BMI > 40 are at increased risk for complications during pregnancy, such as hypertensive disorders of pregnancy, gestational diabetes, obstructive sleep apnea, thromboembolic disease, prolonged labor, operative vaginal delivery and need for cesarean delivery. ?There is an increased risk of airway complications, including inability to place a breathing tube, should an emergency cesarean delivery be required. ?There is an increased risk of aspiration, where stomach contents get into the lungs and can cause inflammation, infection and even respiratory failure. ?Epidural placement is more difficult in obesity, often requires multiple attempts, more often results in inadequate pain relief, more often requires replacement due to movement of the epidural catheter and more often results in accidental dural puncture and post dural puncture headache. ? ?I explained to the patient that we would follow our Obesity in Pregnancy Protocol: ?An anesthesiology consultation is recommended for all patients with a pre-pregnancy BMI ? 45. ?During this consultation the anesthesiologist will ask you questions about your medical history as well as do a physical exam that specifically looks for features that are predictive of complications of anesthesia.  ?The anesthesiologist will also review with you potential complications of anesthesia that are specifically increased due to obesity during pregnancy.  ?If your BMI ? 49 at your 34 week appointment you will have to be evaluated by  an anesthesiologist prior to 36 weeks. ?This evaluation will specifically determine whether you are at high risk for complications of anesthesia. ?If you are found to be at high risk for complications of anesthesia your OB provider will be directed to transfer your care to an OB provider at a hospital that has a higher maternal level of care designation ? ?Patient voiced understanding acknowledging that we had discussed her pathway forward. ? ?

## 2021-07-15 DIAGNOSIS — R69 Illness, unspecified: Secondary | ICD-10-CM | POA: Diagnosis not present

## 2021-07-16 DIAGNOSIS — Z6791 Unspecified blood type, Rh negative: Secondary | ICD-10-CM | POA: Diagnosis not present

## 2021-07-16 DIAGNOSIS — O99212 Obesity complicating pregnancy, second trimester: Secondary | ICD-10-CM | POA: Diagnosis not present

## 2021-07-16 DIAGNOSIS — O26892 Other specified pregnancy related conditions, second trimester: Secondary | ICD-10-CM | POA: Diagnosis not present

## 2021-07-16 DIAGNOSIS — Z3403 Encounter for supervision of normal first pregnancy, third trimester: Secondary | ICD-10-CM | POA: Diagnosis not present

## 2021-08-13 DIAGNOSIS — O99213 Obesity complicating pregnancy, third trimester: Secondary | ICD-10-CM | POA: Diagnosis not present

## 2021-08-20 DIAGNOSIS — O99213 Obesity complicating pregnancy, third trimester: Secondary | ICD-10-CM | POA: Diagnosis not present

## 2021-08-27 DIAGNOSIS — N898 Other specified noninflammatory disorders of vagina: Secondary | ICD-10-CM | POA: Diagnosis not present

## 2021-08-27 DIAGNOSIS — Z23 Encounter for immunization: Secondary | ICD-10-CM | POA: Diagnosis not present

## 2021-08-27 DIAGNOSIS — O99213 Obesity complicating pregnancy, third trimester: Secondary | ICD-10-CM | POA: Diagnosis not present

## 2021-08-27 LAB — OB RESULTS CONSOLE GBS: GBS: NEGATIVE

## 2021-09-03 DIAGNOSIS — R69 Illness, unspecified: Secondary | ICD-10-CM | POA: Diagnosis not present

## 2021-09-03 DIAGNOSIS — R030 Elevated blood-pressure reading, without diagnosis of hypertension: Secondary | ICD-10-CM | POA: Diagnosis not present

## 2021-09-03 DIAGNOSIS — O26843 Uterine size-date discrepancy, third trimester: Secondary | ICD-10-CM | POA: Diagnosis not present

## 2021-09-03 DIAGNOSIS — Z113 Encounter for screening for infections with a predominantly sexual mode of transmission: Secondary | ICD-10-CM | POA: Diagnosis not present

## 2021-09-03 DIAGNOSIS — O09899 Supervision of other high risk pregnancies, unspecified trimester: Secondary | ICD-10-CM | POA: Insufficient documentation

## 2021-09-03 DIAGNOSIS — O99213 Obesity complicating pregnancy, third trimester: Secondary | ICD-10-CM | POA: Diagnosis not present

## 2021-09-03 DIAGNOSIS — O99891 Other specified diseases and conditions complicating pregnancy: Secondary | ICD-10-CM | POA: Diagnosis not present

## 2021-09-03 DIAGNOSIS — Z3403 Encounter for supervision of normal first pregnancy, third trimester: Secondary | ICD-10-CM | POA: Diagnosis not present

## 2021-09-03 LAB — OB RESULTS CONSOLE HIV ANTIBODY (ROUTINE TESTING): HIV: NONREACTIVE

## 2021-09-10 DIAGNOSIS — O99213 Obesity complicating pregnancy, third trimester: Secondary | ICD-10-CM | POA: Diagnosis not present

## 2021-09-10 DIAGNOSIS — O09893 Supervision of other high risk pregnancies, third trimester: Secondary | ICD-10-CM | POA: Diagnosis not present

## 2021-09-17 DIAGNOSIS — O09899 Supervision of other high risk pregnancies, unspecified trimester: Secondary | ICD-10-CM | POA: Diagnosis not present

## 2021-09-17 DIAGNOSIS — O99213 Obesity complicating pregnancy, third trimester: Secondary | ICD-10-CM | POA: Diagnosis not present

## 2021-09-23 ENCOUNTER — Other Ambulatory Visit: Payer: Self-pay | Admitting: Obstetrics and Gynecology

## 2021-09-23 DIAGNOSIS — Z3490 Encounter for supervision of normal pregnancy, unspecified, unspecified trimester: Secondary | ICD-10-CM

## 2021-09-23 NOTE — Progress Notes (Signed)
Victoria Sutton is a 26 y.o. G1P0 female at [redacted]w[redacted]d dated by LMP.  She presents to L&D for IOL for obesity.  Pregnancy Issues: 1. H/o mental health diagnoses 2. BMI 40 or more - Obesity Class III  3. Varicella and rubella non Immune 4. Rh Negative   Prenatal care site: [facility]  EFW: 09/03/21: 6lb14oz(3124g)=76%   Pertinent Results:  Prenatal Labs: Blood type/Rh A neg  Antibody screen neg  Rubella Non-Immune  Varicella Non-Immune  RPR NR  HBsAg Neg  HIV NR  GC neg  Chlamydia neg  Genetic screening negative  1 hour GTT 122  3 hour GTT   GBS neg    5. Post Partum Planning: - Infant feeding: expressed breast and formula  - Contraception: POPs - Tdap given 08/27/21 - Flu given 04/22/21  Victoria Sutton, CNM 09/23/2021 6:16 PM

## 2021-09-26 ENCOUNTER — Inpatient Hospital Stay: Payer: 59 | Admitting: Anesthesiology

## 2021-09-26 ENCOUNTER — Encounter: Payer: Self-pay | Admitting: *Deleted

## 2021-09-26 ENCOUNTER — Encounter
Admission: EM | Disposition: A | Discharge status: Home / Self Care | Payer: Self-pay | Source: Home / Self Care | Attending: Obstetrics and Gynecology

## 2021-09-26 ENCOUNTER — Other Ambulatory Visit: Payer: Self-pay

## 2021-09-26 ENCOUNTER — Inpatient Hospital Stay
Admission: EM | Admit: 2021-09-26 | Discharge: 2021-09-28 | DRG: 787 | Disposition: A | Discharge status: Home / Self Care | Payer: 59 | Attending: Obstetrics and Gynecology | Admitting: Obstetrics and Gynecology

## 2021-09-26 DIAGNOSIS — D62 Acute posthemorrhagic anemia: Secondary | ICD-10-CM | POA: Diagnosis not present

## 2021-09-26 DIAGNOSIS — O99214 Obesity complicating childbirth: Secondary | ICD-10-CM | POA: Diagnosis not present

## 2021-09-26 DIAGNOSIS — O99334 Smoking (tobacco) complicating childbirth: Secondary | ICD-10-CM | POA: Diagnosis present

## 2021-09-26 DIAGNOSIS — F17210 Nicotine dependence, cigarettes, uncomplicated: Secondary | ICD-10-CM | POA: Diagnosis present

## 2021-09-26 DIAGNOSIS — O26893 Other specified pregnancy related conditions, third trimester: Secondary | ICD-10-CM | POA: Diagnosis not present

## 2021-09-26 DIAGNOSIS — O9081 Anemia of the puerperium: Secondary | ICD-10-CM | POA: Diagnosis not present

## 2021-09-26 DIAGNOSIS — Z3A40 40 weeks gestation of pregnancy: Secondary | ICD-10-CM | POA: Diagnosis not present

## 2021-09-26 DIAGNOSIS — R69 Illness, unspecified: Secondary | ICD-10-CM | POA: Diagnosis not present

## 2021-09-26 DIAGNOSIS — G8918 Other acute postprocedural pain: Secondary | ICD-10-CM | POA: Diagnosis not present

## 2021-09-26 DIAGNOSIS — R1084 Generalized abdominal pain: Secondary | ICD-10-CM | POA: Diagnosis not present

## 2021-09-26 DIAGNOSIS — Z3490 Encounter for supervision of normal pregnancy, unspecified, unspecified trimester: Principal | ICD-10-CM | POA: Diagnosis present

## 2021-09-26 DIAGNOSIS — Z6791 Unspecified blood type, Rh negative: Secondary | ICD-10-CM

## 2021-09-26 LAB — COMPREHENSIVE METABOLIC PANEL
ALT: 18 U/L (ref 0–44)
AST: 17 U/L (ref 15–41)
Albumin: 3 g/dL — ABNORMAL LOW (ref 3.5–5.0)
Alkaline Phosphatase: 121 U/L (ref 38–126)
Anion gap: 7 (ref 5–15)
BUN: 11 mg/dL (ref 6–20)
CO2: 22 mmol/L (ref 22–32)
Calcium: 9.2 mg/dL (ref 8.9–10.3)
Chloride: 106 mmol/L (ref 98–111)
Creatinine, Ser: 0.61 mg/dL (ref 0.44–1.00)
GFR, Estimated: >60 mL/min (ref >60)
Glucose, Bld: 90 mg/dL (ref 70–99)
Potassium: 4 mmol/L (ref 3.5–5.1)
Sodium: 135 mmol/L (ref 135–145)
Total Bilirubin: 0.4 mg/dL (ref 0.3–1.2)
Total Protein: 7.1 g/dL (ref 6.5–8.1)

## 2021-09-26 LAB — CBC
HCT: 31.9 % — ABNORMAL LOW (ref 36.0–46.0)
Hemoglobin: 10.5 g/dL — ABNORMAL LOW (ref 12.0–15.0)
MCH: 26.8 pg (ref 26.0–34.0)
MCHC: 32.9 g/dL (ref 30.0–36.0)
MCV: 81.4 fL (ref 80.0–100.0)
Platelets: 246 10*3/uL (ref 150–400)
RBC: 3.92 MIL/uL (ref 3.87–5.11)
RDW: 14.8 % (ref 11.5–15.5)
WBC: 12.3 10*3/uL — ABNORMAL HIGH (ref 4.0–10.5)
nRBC: 0 % (ref 0.0–0.2)

## 2021-09-26 LAB — PROTEIN / CREATININE RATIO, URINE
Creatinine, Urine: 173 mg/dL
Protein Creatinine Ratio: 0.24 mg/mg{Cre} — ABNORMAL HIGH (ref 0.00–0.15)
Total Protein, Urine: 41 mg/dL

## 2021-09-26 LAB — TYPE AND SCREEN
ABO/RH(D): A NEG
Antibody Screen: NEGATIVE

## 2021-09-26 LAB — RPR: RPR Ser Ql: NONREACTIVE

## 2021-09-26 SURGERY — Surgical Case
Anesthesia: Epidural

## 2021-09-26 MED ORDER — MISOPROSTOL 50MCG HALF TABLET
50.0000 ug | ORAL_TABLET | ORAL | Status: DC | PRN
  Administered 2021-09-26: 50 ug via ORAL
  Filled 2021-09-26: qty 1

## 2021-09-26 MED ORDER — SOD CITRATE-CITRIC ACID 500-334 MG/5ML PO SOLN: 30.0000 mL | ORAL | Status: DC | PRN

## 2021-09-26 MED ORDER — ACETAMINOPHEN 500 MG PO TABS
ORAL_TABLET | ORAL | Status: AC
  Filled 2021-09-26: qty 2

## 2021-09-26 MED ORDER — SOD CITRATE-CITRIC ACID 500-334 MG/5ML PO SOLN
30.0000 mL | ORAL | Status: AC
  Administered 2021-09-26: 30 mL via ORAL
  Filled 2021-09-26: qty 30

## 2021-09-26 MED ORDER — ACETAMINOPHEN 325 MG PO TABS: 650.0000 mg | ORAL_TABLET | ORAL | Status: DC | PRN

## 2021-09-26 MED ORDER — KETOROLAC TROMETHAMINE 30 MG/ML IJ SOLN
INTRAMUSCULAR | Status: AC
  Filled 2021-09-26: qty 1

## 2021-09-26 MED ORDER — PHENYLEPHRINE 80 MCG/ML (10ML) SYRINGE FOR IV PUSH (FOR BLOOD PRESSURE SUPPORT): 80.0000 ug | PREFILLED_SYRINGE | INTRAVENOUS | Status: DC | PRN

## 2021-09-26 MED ORDER — MISOPROSTOL 200 MCG PO TABS
ORAL_TABLET | ORAL | Status: AC
  Filled 2021-09-26: qty 4

## 2021-09-26 MED ORDER — LIDOCAINE HCL (PF) 2 % IJ SOLN
INTRAMUSCULAR | Status: DC | PRN
  Administered 2021-09-26: 100 mg via EPIDURAL
  Administered 2021-09-26: 200 mg via EPIDURAL

## 2021-09-26 MED ORDER — LACTATED RINGERS IV SOLN: 500.0000 mL | INTRAVENOUS | Status: DC | PRN

## 2021-09-26 MED ORDER — OXYCODONE-ACETAMINOPHEN 5-325 MG PO TABS: 1.0000 | ORAL_TABLET | ORAL | Status: DC | PRN

## 2021-09-26 MED ORDER — LACTATED RINGERS IV SOLN: INTRAVENOUS | Status: DC

## 2021-09-26 MED ORDER — FENTANYL CITRATE (PF) 100 MCG/2ML IJ SOLN: 50.0000 ug | INTRAMUSCULAR | Status: DC | PRN

## 2021-09-26 MED ORDER — EPHEDRINE 5 MG/ML INJ: 10.0000 mg | INTRAVENOUS | Status: DC | PRN

## 2021-09-26 MED ORDER — LIDOCAINE HCL 2 % IJ SOLN
INTRAMUSCULAR | Status: AC
  Filled 2021-09-26: qty 10

## 2021-09-26 MED ORDER — OXYTOCIN 10 UNIT/ML IJ SOLN
INTRAMUSCULAR | Status: AC
  Filled 2021-09-26: qty 2

## 2021-09-26 MED ORDER — AMMONIA AROMATIC IN INHA
RESPIRATORY_TRACT | Status: AC
  Filled 2021-09-26: qty 10

## 2021-09-26 MED ORDER — SODIUM CHLORIDE 0.9 % IV SOLN
INTRAVENOUS | Status: AC
  Filled 2021-09-26: qty 5

## 2021-09-26 MED ORDER — DIPHENHYDRAMINE HCL 50 MG/ML IJ SOLN: 12.5000 mg | INTRAMUSCULAR | Status: DC | PRN

## 2021-09-26 MED ORDER — OXYCODONE-ACETAMINOPHEN 5-325 MG PO TABS: 2.0000 | ORAL_TABLET | ORAL | Status: DC | PRN

## 2021-09-26 MED ORDER — BUTORPHANOL TARTRATE 2 MG/ML IJ SOLN: 1.0000 mg | INTRAMUSCULAR | Status: DC | PRN

## 2021-09-26 MED ORDER — PHENYLEPHRINE 80 MCG/ML (10ML) SYRINGE FOR IV PUSH (FOR BLOOD PRESSURE SUPPORT)
80.0000 ug | PREFILLED_SYRINGE | INTRAVENOUS | Status: DC | PRN
Start: 2021-09-26 — End: 2021-09-26

## 2021-09-26 MED ORDER — LIDOCAINE HCL (PF) 1 % IJ SOLN: 30.0000 mL | INTRAMUSCULAR | Status: DC | PRN

## 2021-09-26 MED ORDER — KETOROLAC TROMETHAMINE 30 MG/ML IJ SOLN
INTRAMUSCULAR | Status: DC | PRN
  Administered 2021-09-26: 30 mg via INTRAVENOUS

## 2021-09-26 MED ORDER — PROPOFOL 10 MG/ML IV BOLUS
INTRAVENOUS | Status: AC
  Filled 2021-09-26: qty 20

## 2021-09-26 MED ORDER — ONDANSETRON HCL 4 MG/2ML IJ SOLN: 4.0000 mg | Freq: Four times a day (QID) | INTRAMUSCULAR | Status: DC | PRN

## 2021-09-26 MED ORDER — FENTANYL-BUPIVACAINE-NACL 0.5-0.125-0.9 MG/250ML-% EP SOLN
EPIDURAL | Status: AC
  Filled 2021-09-26: qty 250

## 2021-09-26 MED ORDER — BUPIVACAINE HCL (PF) 0.25 % IJ SOLN
INTRAMUSCULAR | Status: DC | PRN
  Administered 2021-09-26 (×2): 4 mL via EPIDURAL

## 2021-09-26 MED ORDER — LIDOCAINE 2% (20 MG/ML) 5 ML SYRINGE: INTRAMUSCULAR | Status: DC | PRN

## 2021-09-26 MED ORDER — MORPHINE SULFATE (PF) 0.5 MG/ML IJ SOLN
INTRAMUSCULAR | Status: AC
  Filled 2021-09-26: qty 10

## 2021-09-26 MED ORDER — MISOPROSTOL 25 MCG QUARTER TABLET
25.0000 ug | ORAL_TABLET | ORAL | Status: DC | PRN
  Administered 2021-09-26 (×2): 25 ug via VAGINAL
  Filled 2021-09-26 (×2): qty 1

## 2021-09-26 MED ORDER — MORPHINE SULFATE (PF) 0.5 MG/ML IJ SOLN
INTRAMUSCULAR | Status: DC | PRN
  Administered 2021-09-26: 4 mg via EPIDURAL

## 2021-09-26 MED ORDER — OXYTOCIN-SODIUM CHLORIDE 30-0.9 UT/500ML-% IV SOLN
1.0000 m[IU]/min | INTRAVENOUS | Status: DC
  Administered 2021-09-26: 2 m[IU]/min via INTRAVENOUS
  Filled 2021-09-26: qty 500

## 2021-09-26 MED ORDER — SODIUM CHLORIDE 0.9% FLUSH: 20.0000 mL | Freq: Once | INTRAVENOUS | Status: DC

## 2021-09-26 MED ORDER — FENTANYL CITRATE (PF) 100 MCG/2ML IJ SOLN
INTRAMUSCULAR | Status: AC
  Filled 2021-09-26: qty 2

## 2021-09-26 MED ORDER — LIDOCAINE-EPINEPHRINE (PF) 1.5 %-1:200000 IJ SOLN
INTRAMUSCULAR | Status: DC | PRN
  Administered 2021-09-26: 3 mL via EPIDURAL

## 2021-09-26 MED ORDER — ONDANSETRON HCL 4 MG/2ML IJ SOLN
INTRAMUSCULAR | Status: DC | PRN
  Administered 2021-09-26: 4 mg via INTRAVENOUS

## 2021-09-26 MED ORDER — ACETAMINOPHEN 500 MG PO TABS
1000.0000 mg | ORAL_TABLET | ORAL | Status: AC
  Administered 2021-09-26: 1000 mg via ORAL

## 2021-09-26 MED ORDER — FENTANYL CITRATE (PF) 100 MCG/2ML IJ SOLN
INTRAMUSCULAR | Status: DC | PRN
Start: 2021-09-26 — End: 2021-09-26
  Administered 2021-09-26: 100 ug via EPIDURAL

## 2021-09-26 MED ORDER — OXYTOCIN-SODIUM CHLORIDE 30-0.9 UT/500ML-% IV SOLN
2.5000 [IU]/h | INTRAVENOUS | Status: DC
  Administered 2021-09-26: 30 [IU] via INTRAVENOUS
  Administered 2021-09-27: 2.5 [IU]/h via INTRAVENOUS
  Filled 2021-09-26 (×2): qty 500

## 2021-09-26 MED ORDER — BUPIVACAINE HCL (PF) 0.5 % IJ SOLN: 60.0000 mL | Freq: Once | INTRAMUSCULAR | Status: DC

## 2021-09-26 MED ORDER — SODIUM CHLORIDE 0.9 % IV SOLN
500.0000 mg | Freq: Once | INTRAVENOUS | Status: AC
  Administered 2021-09-26: 500 mg via INTRAVENOUS

## 2021-09-26 MED ORDER — LIDOCAINE HCL (PF) 1 % IJ SOLN
INTRAMUSCULAR | Status: DC | PRN
  Administered 2021-09-26: 3 mL

## 2021-09-26 MED ORDER — TERBUTALINE SULFATE 1 MG/ML IJ SOLN: 0.2500 mg | Freq: Once | INTRAMUSCULAR | Status: DC | PRN

## 2021-09-26 MED ORDER — GABAPENTIN 300 MG PO CAPS
300.0000 mg | ORAL_CAPSULE | ORAL | Status: AC
  Filled 2021-09-26: qty 1

## 2021-09-26 MED ORDER — SODIUM CHLORIDE (PF) 0.9 % IJ SOLN
INTRAMUSCULAR | Status: AC
  Filled 2021-09-26: qty 50

## 2021-09-26 MED ORDER — OXYTOCIN BOLUS FROM INFUSION: 333.0000 mL | Freq: Once | INTRAVENOUS | Status: DC

## 2021-09-26 MED ORDER — LIDOCAINE HCL (PF) 1 % IJ SOLN
INTRAMUSCULAR | Status: AC
  Filled 2021-09-26: qty 30

## 2021-09-26 MED ORDER — LACTATED RINGERS IV SOLN
500.0000 mL | Freq: Once | INTRAVENOUS | Status: AC
  Administered 2021-09-26: 500 mL via INTRAVENOUS

## 2021-09-26 MED ORDER — FENTANYL-BUPIVACAINE-NACL 0.5-0.125-0.9 MG/250ML-% EP SOLN
12.0000 mL/h | EPIDURAL | Status: DC | PRN
  Administered 2021-09-26: 12 mL/h via EPIDURAL

## 2021-09-26 MED ORDER — CEFAZOLIN IN SODIUM CHLORIDE 3-0.9 GM/100ML-% IV SOLN
3.0000 g | INTRAVENOUS | Status: AC
  Administered 2021-09-26: 3 g via INTRAVENOUS
  Filled 2021-09-26: qty 100

## 2021-09-26 SURGICAL SUPPLY — 36 items
BACTOSHIELD CHG 4% 4OZ (MISCELLANEOUS) ×1
BARRIER ADHS 3X4 INTERCEED (GAUZE/BANDAGES/DRESSINGS) ×2 IMPLANT
CHLORAPREP W/TINT 26 (MISCELLANEOUS) ×2 IMPLANT
DRESSING TELFA ISLAND 4X8 (GAUZE/BANDAGES/DRESSINGS) ×1 IMPLANT
DRSG PAD ABDOMINAL 8X10 ST (GAUZE/BANDAGES/DRESSINGS) ×1 IMPLANT
DRSG TELFA 3X8 NADH (GAUZE/BANDAGES/DRESSINGS) ×2 IMPLANT
ELECT CAUTERY BLADE 6.4 (BLADE) ×2 IMPLANT
ELECT REM PT RETURN 9FT ADLT (ELECTROSURGICAL) ×2
ELECTRODE REM PT RTRN 9FT ADLT (ELECTROSURGICAL) ×1 IMPLANT
GAUZE SPONGE 4X4 12PLY STRL (GAUZE/BANDAGES/DRESSINGS) ×2 IMPLANT
GLOVE SURG SYN 8.0 (GLOVE) ×2 IMPLANT
GLOVE SURG SYN 8.0 PF PI (GLOVE) ×1 IMPLANT
GOWN STRL REUS W/ TWL LRG LVL3 (GOWN DISPOSABLE) ×2 IMPLANT
GOWN STRL REUS W/ TWL XL LVL3 (GOWN DISPOSABLE) ×1 IMPLANT
GOWN STRL REUS W/TWL LRG LVL3 (GOWN DISPOSABLE) ×2
GOWN STRL REUS W/TWL XL LVL3 (GOWN DISPOSABLE) ×1
MANIFOLD NEPTUNE II (INSTRUMENTS) ×2 IMPLANT
MAT PREVALON FULL STRYKER (MISCELLANEOUS) ×2 IMPLANT
NEEDLE HYPO 22GX1.5 SAFETY (NEEDLE) ×2 IMPLANT
NS IRRIG 1000ML POUR BTL (IV SOLUTION) ×2 IMPLANT
PACK C SECTION AR (MISCELLANEOUS) ×2 IMPLANT
PAD DRESSING TELFA 3X8 NADH (GAUZE/BANDAGES/DRESSINGS) ×1 IMPLANT
PAD OB MATERNITY 4.3X12.25 (PERSONAL CARE ITEMS) ×2 IMPLANT
PAD PREP 24X41 OB/GYN DISP (PERSONAL CARE ITEMS) ×2 IMPLANT
SCRUB CHG 4% DYNA-HEX 4OZ (MISCELLANEOUS) ×1 IMPLANT
STAPLER INSORB 30 2030 C-SECTI (MISCELLANEOUS) ×1 IMPLANT
STRAP SAFETY 5IN WIDE (MISCELLANEOUS) ×2 IMPLANT
SUCT VACUUM KIWI BELL (SUCTIONS) ×1 IMPLANT
SUT CHROMIC 0 CT 1 (SUTURE) ×1 IMPLANT
SUT CHROMIC 1 CTX 36 (SUTURE) ×6 IMPLANT
SUT CHROMIC GUT 1-0 18 CT-1 (SUTURE) ×1 IMPLANT
SUT PLAIN GUT 0 (SUTURE) ×4 IMPLANT
SUT VIC AB 0 CT1 36 (SUTURE) ×4 IMPLANT
SYR 30ML LL (SYRINGE) ×4 IMPLANT
TAPE MEDIFIX FOAM 3 (GAUZE/BANDAGES/DRESSINGS) ×1 IMPLANT
WATER STERILE IRR 500ML POUR (IV SOLUTION) ×2 IMPLANT

## 2021-09-26 NOTE — Progress Notes (Signed)
Labor Progress Note  Victoria Sutton is a 26 y.o. G1P0000 at [redacted]w[redacted]d by LMP c/w [place] at [redacted]w[redacted]d; admitted for induction of labor due to Obesity, BMI 48.7 currently.   Subjective: comfortable with epidural  Objective: BP (!) 107/44   Pulse 72   Temp 98.2 F (36.8 C) (Oral)   Resp 16   Ht 5\' 4"  (1.626 m)   Wt 128.8 kg   LMP 11/02/2020 (Approximate)   SpO2 100%   BMI 48.75 kg/m  Notable VS details: reviewed.   Vitals:   09/26/21 0043 09/26/21 0330 09/26/21 0701 09/26/21 1421  BP: 140/75 133/89 136/75 137/66   09/26/21 1427 09/26/21 1432 09/26/21 1437 09/26/21 1442  BP: 133/65 127/65 125/65 132/67   09/26/21 1452 09/26/21 1502  BP: (!) 124/59 (!) 107/44     Fetal Assessment: FHT:  FHR: 135 bpm, variability: moderate,  accelerations:  Present,  decelerations:  Absent Category/reactivity:  Category I UC:   irregular, every 2-5 minutes SVE:   6/70/-3, soft/posterior. Normal bloody show.  - Cook Cath fell out around 1245   Membrane status:intact Amniotic color: n/a  Labs: Lab Results  Component Value Date   WBC 12.3 (H) 09/26/2021   HGB 10.5 (L) 09/26/2021   HCT 31.9 (L) 09/26/2021   MCV 81.4 09/26/2021   PLT 246 09/26/2021    Assessment / Plan: IOL G1P0 at 40.0wks due to morbid obesity  Labor:  s/p 2 doses of vaginal cytotec, Cook cath and oral cytotec given. Pitocin started 1430   Dr 11/26/2021 updated, unable to break water due to high station.  Preeclampsia:   BP noted mild range x 1, no sx, CMP and urine P/C WNL Fetal Wellbeing:  Category I- Cat II tracing post epidural, now cat I Pain Control:  Epidural I/D:   GBS Neg   Victoria Sutton Victoria Sutton, CNM 09/26/2021, 4:32 PM

## 2021-09-26 NOTE — Anesthesia Procedure Notes (Signed)
Date/Time: 09/26/2021 10:43 PM Performed by: Elmarie Mainland, CRNA Pre-anesthesia Checklist: Patient identified, Emergency Drugs available, Suction available and Patient being monitored Patient Re-evaluated:Patient Re-evaluated prior to induction Oxygen Delivery Method: Nasal cannula

## 2021-09-26 NOTE — Brief Op Note (Signed)
09/26/2021  11:24 PM  PATIENT:  Victoria Sutton  26 y.o. female  PRE-OPERATIVE DIAGNOSIS:  40+0 weeks   Active phase arrest  POST-OPERATIVE DIAGNOSIS:  same   PROCEDURE:  Procedure(s): CESAREAN SECTION Primary LTCS   SURGEON:  Surgeon(s) and Role:    * Raechal Raben, Gwen Her, MD - Primary  PHYSICIAN ASSISTANT: Hassan Buckler , cnm   ASSISTANTS: none   ANESTHESIA:   epidural  EBL:  qbl : 585 cc IOF 800 cc UO 25cc  BLOOD ADMINISTERED:none  DRAINS: Urinary Catheter (Foley)   LOCAL MEDICATIONS USED:  MARCAINE     SPECIMEN:  No Specimen  DISPOSITION OF SPECIMEN:  N/A  COUNTS:  YES  TOURNIQUET:  * No tourniquets in log *  DICTATION: .Other Dictation: Dictation Number verbal  PLAN OF CARE: Admit to inpatient   PATIENT DISPOSITION:  PACU - hemodynamically stable.   Delay start of Pharmacological VTE agent (>24hrs) due to surgical blood loss or risk of bleeding: not applicable

## 2021-09-26 NOTE — H&P (Signed)
OB History & Physical   History of Present Illness:   Chief Complaint: IOL for obesity  HPI:  Victoria Sutton is a 26 y.o. G1P0000 female at [redacted]w[redacted]d dated by LMP.  She presents to L&D for IOL for obesity  Reports active fetal movement  Contractions: irregular cramping  LOF/SROM: none Vaginal bleeding: none  Factors complicating pregnancy:  1. H/o mental health diagnoses 2. BMI 40 or more - Obesity Class III  3. Varicella and rubella non Immune 4. Rh Negative  Patient Active Problem List   Diagnosis Date Noted   Encounter for induction of labor 09/26/2021     Maternal Medical History:   Past Medical History:  Diagnosis Date   Depression    Diabetes mellitus without complication (HCC)     Past Surgical History:  Procedure Laterality Date   NO PAST SURGERIES      No Known Allergies  Prior to Admission medications   Medication Sig Start Date End Date Taking? Authorizing Provider  Prenatal Vit-Fe Fumarate-FA (PRENATAL MULTIVITAMIN) TABS tablet Take 1 tablet by mouth daily at 12 noon.   Yes [provider]  metFORMIN (GLUCOPHAGE) 500 MG tablet Take by mouth. Patient not taking: Reported on 09/26/2021 02/05/20   [provider]  phentermine (ADIPEX-P) 37.5 MG tablet Take 37.5 mg by mouth at bedtime. Patient not taking: Reported on 09/26/2021 02/17/20   [provider]  traZODone (DESYREL) 50 MG tablet Take 50 mg by mouth at bedtime. Patient not taking: Reported on 09/26/2021 02/16/20   [provider]  TRINTELLIX 20 MG TABS tablet Take 20 mg by mouth daily. Patient not taking: Reported on 09/26/2021 02/25/20   [provider]  escitalopram (LEXAPRO) 5 MG tablet Take 15 mg by mouth daily.  03/01/20  [provider]  norethindrone-ethinyl estradiol-iron (ESTROSTEP FE,TILIA FE,TRI-LEGEST FE) 1-20/1-30/1-35 MG-MCG tablet Take 1 tablet by mouth daily.  03/01/20  [provider]     Prenatal care site:  [facility]  OB/GYN  OB History  Gravida Para Term Preterm AB Living  1 0 0 0 0 0  SAB IAB Ectopic Multiple Live Births  0 0 0 0 0    # Outcome Date GA Lbr Len/2nd Weight Sex Delivery Anes PTL Lv  1 Current              Social History: She  reports that she has been smoking cigarettes. She has never used smokeless tobacco. She reports that she does not drink alcohol and does not use drugs.  Family History: family history includes Diabetes in her father and mother; Hyperlipidemia in her father; Hypertension in her father and mother.   Review of Systems: A full review of systems was performed and negative except as noted in the HPI.     Physical Exam:  Vital Signs: BP 140/75 (BP Location: Left Arm)   Pulse 80   Temp 98.4 F (36.9 C) (Oral)   Resp 16   Ht 5\' 4"  (1.626 m)   Wt 128.8 kg   LMP 11/02/2020 (Approximate)   BMI 48.75 kg/m  Physical Exam Constitutional:      Appearance: Normal appearance. She is obese.  HENT:     Head: Normocephalic.  Cardiovascular:     Rate and Rhythm: Normal rate and regular rhythm.     Pulses: Normal pulses.  Pulmonary:     Effort: Pulmonary effort is normal.  Genitourinary:    General: Normal vulva.     Vagina: Normal.     Cervix:  No cervical bleeding.     Uterus: Enlarged.   Musculoskeletal:        General: Normal range of motion.     Cervical back: Normal range of motion and neck supple.  Skin:    General: Skin is warm and dry.  Neurological:     Mental Status: She is alert and oriented to person, place, and time.  Psychiatric:        Mood and Affect: Mood normal.        Behavior: Behavior normal.        Thought Content: Thought content normal.        Judgment: Judgment normal.    General: no acute distress.  HEENT: normocephalic, atraumatic Heart: regular rate & rhythm.  No murmurs/rubs/gallops Lungs: clear to auscultation bilaterally, normal respiratory effort Abdomen: soft, gravid, non-tender;  EFW: 6.14 lbs Pelvic:   External:  Normal external female genitalia  Cervix: Dilation: Closed / Effacement (%): Thick / Station: -3    Extremities: non-tender, symmetric, no edema bilaterally.  DTRs: +2  Neurologic: Alert & oriented x 3.    Results for orders placed or performed during the hospital encounter of 09/26/21 (from the past 24 hour(s))  CBC     Status: Abnormal   Collection Time: 09/26/21 12:52 AM  Result Value Ref Range   WBC 12.3 (H) 4.0 - 10.5 K/uL   RBC 3.92 3.87 - 5.11 MIL/uL   Hemoglobin 10.5 (L) 12.0 - 15.0 g/dL   HCT 16.131.9 (L) 09.636.0 - 04.546.0 %   MCV 81.4 80.0 - 100.0 fL   MCH 26.8 26.0 - 34.0 pg   MCHC 32.9 30.0 - 36.0 g/dL   RDW 40.914.8 81.111.5 - 91.415.5 %   Platelets 246 150 - 400 K/uL   nRBC 0.0 0.0 - 0.2 %  Type and screen     Status: None   Collection Time: 09/26/21 12:52 AM  Result Value Ref Range   ABO/RH(D) A NEG    Antibody Screen NEG    Sample Expiration      09/29/2021,2359 Performed at [facility], [address] [place], [place] [address]     Pertinent Results:  Prenatal Labs: Blood type/Rh A neg  Antibody screen neg  Rubella Non-Immune  Varicella Non-Immune  RPR NR  HBsAg Neg  HIV NR  GC neg  Chlamydia neg  Genetic screening negative  1 hour GTT 122  3 hour GTT    GBS neg     FHT:  FHR: 140 bpm, variability: moderate,  accelerations:  Present,  decelerations:  Absent Category/reactivity:  Category I UC:   irregular cramping   Cephalic by Leopolds and SVE   No results found.  Assessment:  Victoria Sutton is a 26 y.o. G1P0000 female at 4929w0d with obesity, h/o mental health diagnosis, RH negative.   Plan:  1. Admit to Labor & Delivery - consents reviewed and obtained  2. Fetal Well being  - Fetal Tracing: category 1 - Group B Streptococcus ppx not indicated: GBS neg - Presentation: cephalic confirmed by US   3. Routine OB: - Prenatal labs reviewed, as above - Rh neg - CBC, T&S, RPR on admit - Clear fluids, IVF  4. Induction of labor  - Contractions  monitored with external toco - Pelvis adequate for trial of labor  - Plan for induction with misoprostol, oxytocin, AROM, and cervical balloon  - Augmentation with oxytocin and AROM as appropriate  - Plan for  continuous fetal monitoring - Maternal pain control  as desired; planning regional anesthesia - Anticipate vaginal delivery  5. Post Partum Planning: - Infant feeding: expressed breast and formula  - Contraception: POPs - Tdap given 08/27/21 - Flu given 04/22/21  Jimmie Dattilio LUCY Delmar Landau, CNM 09/26/21 2:15 AM  Chari Manning, CNM Certified Nurse Midwife [facility]

## 2021-09-26 NOTE — Progress Notes (Signed)
Patient ID: Victoria Sutton, female   DOB: [birth year], 26 y.o.   MRN: [id number] Cx still at 6 cm . CTX mild from patient perspective .  AROM by me : 6 cm /90/-1, clear fluid . IUPC placed  Continue to labor and increase pitocin to gain adequate ctx pattern

## 2021-09-26 NOTE — Discharge Summary (Signed)
Obstetrical Discharge Summary  Patient Name: Victoria Sutton DOB: 04-02-1996 MRN: [id number]  Date of Admission: 09/26/2021 Date of Delivery: 09/26/21 Delivered by: Schermerhorn MD Date of Discharge: 09/28/2021  Primary OB: [facility]  WUJ:[id number]'W last menstrual period was 11/02/2020 (approximate). EDC Estimated Date of Delivery: 09/26/21 Gestational Age at Delivery: [redacted]w[redacted]d   Antepartum complications:  1. H/o mental health diagnoses 2. BMI 40 or more - Obesity Class III  3. Varicella and rubella non Immune 4. Rh Negative  Admitting Diagnosis: Obesity, induction of labor Secondary Diagnosis: Patient Active Problem List   Diagnosis Date Noted   Uterine size-date discrepancy in third trimester 09/27/2021   Encounter for induction of labor 09/26/2021   Rubella non-immune status, antepartum 09/03/2021   Susceptible to varicella (non-immune), currently pregnant 09/03/2021   Obesity affecting pregnancy in third trimester 06/18/2021   Rh negative status during pregnancy in second trimester 06/18/2021   Encounter for supervision of normal first pregnancy in third trimester 02/14/2021   Moderate episode of recurrent major depressive disorder (HCC) 02/06/2014    Augmentation: AROM, Pitocin, Cytotec, and Cook Catheter Complications: None Intrapartum complications/course: active phase arrest after induction of labor including cytotec, cook cath, pitocin and AROM.  Date of Delivery: 09/26/21 Delivered By: Schermerhorn MD Delivery Type: primary cesarean section, low transverse incision Anesthesia: epidural Placenta: manual Laceration: none Episiotomy: none Newborn Data: Live born infant female "[place]" Birth Weight: 8#10  APGAR: 9/9  Newborn Delivery   Birth date/time: 09/26/21 at 2249 Delivery type: LTCS vac assisted      Postpartum Procedures: None Edinburgh:     09/27/2021    3:51 PM  Inocente Salles Postnatal Depression Scale Screening Tool  I have been able to laugh and see  the funny side of things. 0  I have looked forward with enjoyment to things. 1  I have blamed myself unnecessarily when things went wrong. 2  I have been anxious or worried for no good reason. 2  I have felt scared or panicky for no good reason. 2  Things have been getting on top of me. 2  I have been so unhappy that I have had difficulty sleeping. 1  I have felt sad or miserable. 1  I have been so unhappy that I have been crying. 1  The thought of harming myself has occurred to me. 1  Edinburgh Postnatal Depression Scale Total 13      Post partum course: Cesarean Section:  Patient had an uncomplicated postpartum course.  By time of discharge on POD#2, her pain was controlled on oral pain medications; she had appropriate lochia and was ambulating, voiding without difficulty, tolerating regular diet and passing flatus.   She was deemed stable for discharge to home.    Discharge Physical Exam:  BP (!) 110/55 (BP Location: Right Arm)   Pulse 73   Temp 97.8 F (36.6 C)   Resp 18   Ht 5\' 4"  (1.626 m)   Wt 128.8 kg   LMP 11/02/2020 (Approximate)   SpO2 100%   Breastfeeding Unknown   BMI 48.75 kg/m   General: NAD CV: RRR Pulm: CTABL, nl effort ABD: s/nd/nt, fundus firm and below the umbilicus Lochia: moderate Incision: c/d/i, healing well, no significant drainage, no dehiscence, no significant erythema  DVT Evaluation: LE non-ttp, no evidence of DVT on exam.  Hemoglobin  Date Value Ref Range Status  09/27/2021 8.8 (L) 12.0 - 15.0 g/dL Final   HCT  Date Value Ref Range Status  09/27/2021 27.1 (L) 36.0 - 46.0 %  Final     Disposition: stable, discharge to home. Baby Feeding: breastmilk and formula Baby Disposition: home with mom  Rh Immune globulin given: Rh neg - Rhogam given 09/27/2021 Rubella vaccine given: Offered prior to discharge - declined  Varicella vaccine given: Offered prior to discharge - declined  Tdap vaccine given in AP or PP setting: 08/27/21 Flu vaccine  given in AP or PP setting: 04/22/21  Contraception: POP's  Prenatal Labs:  Blood type/Rh A neg  Antibody screen neg  Rubella Non-Immune  Varicella Non-Immune  RPR NR  HBsAg Neg  HIV NR  GC neg  Chlamydia neg  Genetic screening negative  1 hour GTT 122  3 hour GTT    GBS neg     Plan:  Breah Joa was discharged to home in good condition. Follow-up appointment with delivering provider in 2 weeks.  Discharge Medications: Allergies as of 09/28/2021   No Known Allergies      Medication List     STOP taking these medications    metFORMIN 500 MG tablet Commonly known as: GLUCOPHAGE   phentermine 37.5 MG tablet Commonly known as: ADIPEX-P   traZODone 50 MG tablet Commonly known as: DESYREL   Trintellix 20 MG Tabs tablet Generic drug: vortioxetine HBr       TAKE these medications    acetaminophen 500 MG tablet Commonly known as: TYLENOL Take 2 tablets (1,000 mg total) by mouth every 6 (six) hours as needed.   ferrous sulfate 325 (65 FE) MG tablet Take 1 tablet (325 mg total) by mouth daily.   ibuprofen 600 MG tablet Commonly known as: ADVIL Take 1 tablet (600 mg total) by mouth every 6 (six) hours as needed for mild pain or cramping.   oxyCODONE 5 MG immediate release tablet Commonly known as: Oxy IR/ROXICODONE Take 1 tablet (5 mg total) by mouth every 4 (four) hours as needed for up to 7 days for moderate pain.   prenatal multivitamin Tabs tablet Take 1 tablet by mouth daily at 12 noon.         Follow-up Information     Schermerhorn, Ihor Austin, MD Follow up in 2 week(s).   Specialty: Obstetrics and Gynecology Why: post-op appointment Contact information: [address] [place] [address] [phone]                 Signed:  Al Corpus 09/28/2021  10:02 AM  Margaretmary Eddy, CNM Certified Nurse Midwife [place]  [facility]

## 2021-09-26 NOTE — Progress Notes (Signed)
Labor Progress Note  Lillybeth Tal is a 26 y.o. G1P0000 at [redacted]w[redacted]d by LMP c/w [place] at [redacted]w[redacted]d; admitted for induction of labor due to Obesity, BMI 48.7 currently.   Subjective: comfortable with epidural  Objective: BP 126/65   Pulse 91   Temp 98.3 F (36.8 C) (Oral)   Resp 16   Ht 5\' 4"  (1.626 m)   Wt 128.8 kg   LMP 11/02/2020 (Approximate)   SpO2 100%   BMI 48.75 kg/m  Notable VS details: reviewed.   Vitals:   09/26/21 1502 09/26/21 1511 09/26/21 1516 09/26/21 1531  BP: (!) 107/44 (!) 117/103 127/71 131/64   09/26/21 1546 09/26/21 1601 09/26/21 1616 09/26/21 1632  BP: 129/69 129/66 131/73 (!) 86/54   09/26/21 1634 09/26/21 1702 09/26/21 1732 09/26/21 1802  BP: 110/60 (!) 127/59 (!) 120/58 126/65     Fetal Assessment: FHT:  FHR: 140 bpm, variability: moderate,  accelerations:  Present,  decelerations:  Present earlies noted intermittently.  Category/reactivity:  Category I UC:   regular, every 2-2.5 minutes, MVUs variable 150-200, periods of tachysystole. Pitocin currently off  SVE:   6/70/-3, soft/ Normal bloody show.  - no cervical change over 8hours, or since AROM 4hours ago.   Membrane status:  AROM by Dr 11-17-1973 at 1745 Amniotic color: clear  Labs: Lab Results  Component Value Date   WBC 12.3 (H) 09/26/2021   HGB 10.5 (L) 09/26/2021   HCT 31.9 (L) 09/26/2021   MCV 81.4 09/26/2021   PLT 246 09/26/2021    Assessment / Plan: IOL G1P0 at 40.0wks due to morbid obesity  Labor:  s/p 2 doses of vaginal cytotec, Cook cath and oral cytotec given. Pitocin started 1430   AROM and IUPC by Dr 11/26/2021 at [phone]. NO cervical change x 8hrs, Dr [id number] notified, will proceed to CS.  Preeclampsia:   BP noted mild range x 1, no sx, CMP and urine P/C WNL Fetal Wellbeing:  Category I- periods of cat II tracing with earlies, variables at times.  Pain Control:  Epidural I/D:   GBS Neg   [name] Marisela Line, CNM 09/26/2021, 9:59 PM

## 2021-09-26 NOTE — Transfer of Care (Signed)
Immediate Anesthesia Transfer of Care Note  Patient: Designer, jewellery  Procedure(s) Performed: CESAREAN SECTION  Patient Location: PACU and Mother/Baby  Anesthesia Type:Epidural  Level of Consciousness: awake and patient cooperative  Airway & Oxygen Therapy: Patient Spontanous Breathing  Post-op Assessment: Report given to RN and Post -op Vital signs reviewed and stable  Post vital signs: Reviewed  Last Vitals:  Vitals Value Taken Time  BP 133/88 2342 09/26/2021  Temp    Pulse 78   Resp 14   SpO2 98     Last Pain:  Vitals:   09/26/21 1800  TempSrc: Oral  PainSc: 0-No pain      Patients Stated Pain Goal: 0 (09/26/21 1400)  Complications: No notable events documented.

## 2021-09-26 NOTE — Anesthesia Preprocedure Evaluation (Signed)
Anesthesia Evaluation  Patient identified by MRN, date of birth, ID band Patient awake    Reviewed: Allergy & Precautions, H&P , NPO status , Patient's Chart, lab work & pertinent test results  Airway Mallampati: III       Dental no notable dental hx.    Pulmonary Current Smoker,    Pulmonary exam normal        Cardiovascular negative cardio ROS Normal cardiovascular exam     Neuro/Psych PSYCHIATRIC DISORDERS Depression negative neurological ROS     GI/Hepatic negative GI ROS, Neg liver ROS,   Endo/Other    Renal/GU   negative genitourinary   Musculoskeletal   Abdominal   Peds  Hematology negative hematology ROS (+)   Anesthesia Other Findings   Reproductive/Obstetrics (+) Pregnancy                             Anesthesia Physical Anesthesia Plan  ASA: 2  Anesthesia Plan: Epidural   Post-op Pain Management:    Induction:   PONV Risk Score and Plan:   Airway Management Planned:   Additional Equipment:   Intra-op Plan:   Post-operative Plan:   Informed Consent: I have reviewed the patients History and Physical, chart, labs and discussed the procedure including the risks, benefits and alternatives for the proposed anesthesia with the patient or authorized representative who has indicated his/her understanding and acceptance.       Plan Discussed with: Anesthesiologist and CRNA  Anesthesia Plan Comments:         Anesthesia Quick Evaluation

## 2021-09-26 NOTE — Anesthesia Procedure Notes (Signed)
Epidural Patient location during procedure: OB Start time: 09/26/2021 2:16 PM End time: 09/26/2021 2:17 PM  Staffing Anesthesiologist: Piscitello, Cleda Mccreedy, MD Resident/CRNA: Karoline Caldwell, CRNA Performed: anesthesiologist   Preanesthetic Checklist Completed: patient identified, IV checked, site marked, risks and benefits discussed, surgical consent, monitors and equipment checked, pre-op evaluation and timeout performed  Epidural Patient position: sitting Prep: ChloraPrep Patient monitoring: heart rate, continuous pulse ox and blood pressure Approach: midline Location: L3-L4 Injection technique: LOR saline  Needle:  Needle type: Tuohy  Needle gauge: 17 G Needle length: 9 cm and 9 Needle insertion depth: 8.5 cm Catheter type: closed end flexible Catheter size: 19 Gauge Catheter at skin depth: 14 cm Test dose: negative and 1.5% lidocaine with Epi 1:200 K  Assessment Events: blood not aspirated, injection not painful, no injection resistance, no paresthesia and negative IV test  Additional Notes 1 attempt Pt. Evaluated and documentation done after procedure finished. Patient identified. Risks/Benefits/Options discussed with patient including but not limited to bleeding, infection, nerve damage, paralysis, failed block, incomplete pain control, headache, blood pressure changes, nausea, vomiting, reactions to medication both or allergic, itching and postpartum back pain. Confirmed with bedside nurse the patient's most recent platelet count. Confirmed with patient that they are not currently taking any anticoagulation, have any bleeding history or any family history of bleeding disorders. Patient expressed understanding and wished to proceed. All questions were answered. Sterile technique was used throughout the entire procedure. Please see nursing notes for vital signs. Test dose was given through epidural catheter and negative prior to continuing to dose epidural or start infusion.  Warning signs of high block given to the patient including shortness of breath, tingling/numbness in hands, complete motor block, or any concerning symptoms with instructions to call for help. Patient was given instructions on fall risk and not to get out of bed. All questions and concerns addressed with instructions to call with any issues or inadequate analgesia.    Patient tolerated the insertion well without immediate complications.Reason for block:procedure for pain

## 2021-09-26 NOTE — Progress Notes (Signed)
Labor Progress Note  Victoria Sutton is a 26 y.o. G1P0000 at [redacted]w[redacted]d by LMP c/w [place] at [redacted]w[redacted]d; admitted for induction of labor due to Obesity, BMI 48.7 currently.   Subjective: cramping and back pain, did not get much rest last night.   Objective: BP 136/75 (BP Location: Left Arm)   Pulse (!) 57   Temp 98.2 F (36.8 C) (Oral)   Resp 16   Ht 5\' 4"  (1.626 m)   Wt 128.8 kg   LMP 11/02/2020 (Approximate)   BMI 48.75 kg/m  Notable VS details: reviewed.   Vitals:   09/26/21 0043 09/26/21 0330 09/26/21 0701  BP: 140/75 133/89 136/75     Fetal Assessment: FHT:  FHR: 135 bpm, variability: moderate,  accelerations:  Present,  decelerations:  Absent Category/reactivity:  Category I UC:   irregular, every 2-5 minutes SVE:   1/50/-3, soft/posterior. Small amt bloody show.  03-07-1971 Cath placed with ease, 55ml normal saline instilled into each vaginal and uterine balloons.  - Pt tol well.  Membrane status:intact Amniotic color: n/a  Labs: Lab Results  Component Value Date   WBC 12.3 (H) 09/26/2021   HGB 10.5 (L) 09/26/2021   HCT 31.9 (L) 09/26/2021   MCV 81.4 09/26/2021   PLT 246 09/26/2021    Assessment / Plan: IOL G1P0 at 40.0wks due to morbid obesity  Labor:  s/p 2 doses of vaginal cytotec, now [facility] cath with oral cytotec [facility] given.  Preeclampsia:   BP noted mild range x 1, no sx, CMP and urine P/C ordered.  Fetal Wellbeing:  Category I Pain Control:  Labor support without medications I/D:   GBS Neg Anticipated MOD:  NSVD  Raydin Bielinski, CNM 09/26/2021, 8:59 AM

## 2021-09-26 NOTE — Progress Notes (Signed)
Patient ID: Victoria Sutton, female   DOB: [birth year], 26 y.o.   MRN: [id number] 5 hrs AROM with adequate CTX and MVU . No cervical change . APA . I have counseled for primary LTCS . Marland Kitchen Risks explained . 3 gm ancef + 500 mg zithromycin ordered . All questions answered . Proceed

## 2021-09-27 ENCOUNTER — Encounter: Payer: Self-pay | Admitting: Obstetrics and Gynecology

## 2021-09-27 ENCOUNTER — Other Ambulatory Visit: Payer: Self-pay

## 2021-09-27 DIAGNOSIS — O26843 Uterine size-date discrepancy, third trimester: Secondary | ICD-10-CM | POA: Insufficient documentation

## 2021-09-27 LAB — CBC
HCT: 27.1 % — ABNORMAL LOW (ref 36.0–46.0)
Hemoglobin: 8.8 g/dL — ABNORMAL LOW (ref 12.0–15.0)
MCH: 26.9 pg (ref 26.0–34.0)
MCHC: 32.5 g/dL (ref 30.0–36.0)
MCV: 82.9 fL (ref 80.0–100.0)
Platelets: 196 10*3/uL (ref 150–400)
RBC: 3.27 MIL/uL — ABNORMAL LOW (ref 3.87–5.11)
RDW: 14.8 % (ref 11.5–15.5)
WBC: 13.7 10*3/uL — ABNORMAL HIGH (ref 4.0–10.5)
nRBC: 0 % (ref 0.0–0.2)

## 2021-09-27 LAB — FETAL SCREEN: Fetal Screen: NEGATIVE

## 2021-09-27 LAB — ABO/RH: ABO/RH(D): A NEG

## 2021-09-27 LAB — CREATININE, SERUM
Creatinine, Ser: 0.64 mg/dL (ref 0.44–1.00)
GFR, Estimated: >60 mL/min (ref >60)

## 2021-09-27 MED ORDER — MEASLES, MUMPS & RUBELLA VAC IJ SOLR: 0.5000 mL | INTRAMUSCULAR | Status: DC | PRN

## 2021-09-27 MED ORDER — DIPHENHYDRAMINE HCL 25 MG PO CAPS: 25.0000 mg | ORAL_CAPSULE | Freq: Four times a day (QID) | ORAL | Status: DC | PRN

## 2021-09-27 MED ORDER — PRENATAL MULTIVITAMIN CH
1.0000 | ORAL_TABLET | Freq: Every day | ORAL | Status: DC
  Administered 2021-09-27 – 2021-09-28 (×2): 1 via ORAL
  Filled 2021-09-27 (×2): qty 1

## 2021-09-27 MED ORDER — ACETAMINOPHEN 500 MG PO TABS
1000.0000 mg | ORAL_TABLET | Freq: Four times a day (QID) | ORAL | Status: DC
  Administered 2021-09-27 – 2021-09-28 (×5): 1000 mg via ORAL
  Filled 2021-09-27 (×6): qty 2

## 2021-09-27 MED ORDER — SODIUM CHLORIDE 0.9 % IV SOLN
INTRAVENOUS | Status: AC | PRN
  Administered 2021-09-26: 20 mL

## 2021-09-27 MED ORDER — VARICELLA VIRUS VACCINE LIVE 1350 PFU/0.5ML IJ SUSR: 0.5000 mL | INTRAMUSCULAR | Status: DC | PRN

## 2021-09-27 MED ORDER — GABAPENTIN 300 MG PO CAPS
300.0000 mg | ORAL_CAPSULE | Freq: Every day | ORAL | Status: DC
  Administered 2021-09-27: 300 mg via ORAL
  Filled 2021-09-27: qty 1

## 2021-09-27 MED ORDER — SENNOSIDES-DOCUSATE SODIUM 8.6-50 MG PO TABS
2.0000 | ORAL_TABLET | Freq: Every day | ORAL | Status: DC
  Administered 2021-09-28: 2 via ORAL
  Filled 2021-09-27: qty 2

## 2021-09-27 MED ORDER — KETOROLAC TROMETHAMINE 30 MG/ML IJ SOLN
30.0000 mg | Freq: Four times a day (QID) | INTRAMUSCULAR | Status: AC
  Administered 2021-09-27 – 2021-09-28 (×4): 30 mg via INTRAVENOUS
  Filled 2021-09-27 (×4): qty 1

## 2021-09-27 MED ORDER — COCONUT OIL OIL
1.0000 "application " | TOPICAL_OIL | Status: DC | PRN
  Filled 2021-09-27: qty 120

## 2021-09-27 MED ORDER — TETANUS-DIPHTH-ACELL PERTUSSIS 5-2.5-18.5 LF-MCG/0.5 IM SUSY: 0.5000 mL | PREFILLED_SYRINGE | Freq: Once | INTRAMUSCULAR | Status: DC

## 2021-09-27 MED ORDER — KETOROLAC TROMETHAMINE 30 MG/ML IJ SOLN
30.0000 mg | Freq: Once | INTRAMUSCULAR | Status: AC
  Administered 2021-09-27: 30 mg via INTRAVENOUS
  Filled 2021-09-27: qty 1

## 2021-09-27 MED ORDER — RHO D IMMUNE GLOBULIN 1500 UNIT/2ML IJ SOSY
300.0000 ug | PREFILLED_SYRINGE | Freq: Once | INTRAMUSCULAR | Status: AC
  Administered 2021-09-27: 300 ug via INTRAVENOUS
  Filled 2021-09-27: qty 2

## 2021-09-27 MED ORDER — ENOXAPARIN SODIUM 40 MG/0.4ML IJ SOSY
40.0000 mg | PREFILLED_SYRINGE | INTRAMUSCULAR | Status: DC
  Administered 2021-09-27 – 2021-09-28 (×2): 40 mg via SUBCUTANEOUS
  Filled 2021-09-27 (×2): qty 0.4

## 2021-09-27 MED ORDER — 0.9 % SODIUM CHLORIDE (POUR BTL) OPTIME
TOPICAL | Status: DC | PRN
  Administered 2021-09-26: 1000 mL

## 2021-09-27 MED ORDER — SIMETHICONE 80 MG PO CHEW: 80.0000 mg | CHEWABLE_TABLET | ORAL | Status: DC | PRN

## 2021-09-27 MED ORDER — MORPHINE SULFATE (PF) 2 MG/ML IV SOLN: 1.0000 mg | INTRAVENOUS | Status: DC | PRN

## 2021-09-27 MED ORDER — IBUPROFEN 600 MG PO TABS
600.0000 mg | ORAL_TABLET | Freq: Four times a day (QID) | ORAL | Status: DC
  Administered 2021-09-28: 600 mg via ORAL
  Filled 2021-09-27: qty 1

## 2021-09-27 MED ORDER — ZOLPIDEM TARTRATE 5 MG PO TABS: 5.0000 mg | ORAL_TABLET | Freq: Every evening | ORAL | Status: DC | PRN

## 2021-09-27 MED ORDER — MENTHOL 3 MG MT LOZG: 1.0000 | LOZENGE | OROMUCOSAL | Status: DC | PRN

## 2021-09-27 MED ORDER — OXYTOCIN-SODIUM CHLORIDE 30-0.9 UT/500ML-% IV SOLN: 2.5000 [IU]/h | INTRAVENOUS | Status: AC

## 2021-09-27 MED ORDER — SIMETHICONE 80 MG PO CHEW
80.0000 mg | CHEWABLE_TABLET | Freq: Three times a day (TID) | ORAL | Status: DC
  Administered 2021-09-27 – 2021-09-28 (×4): 80 mg via ORAL
  Filled 2021-09-27 (×5): qty 1

## 2021-09-27 MED ORDER — DIBUCAINE (PERIANAL) 1 % EX OINT: 1.0000 "application " | TOPICAL_OINTMENT | CUTANEOUS | Status: DC | PRN

## 2021-09-27 MED ORDER — BUPIVACAINE HCL (PF) 0.5 % IJ SOLN
INTRAMUSCULAR | Status: DC | PRN
  Administered 2021-09-26: 60 mL

## 2021-09-27 MED ORDER — OXYCODONE HCL 5 MG PO TABS: 5.0000 mg | ORAL_TABLET | ORAL | Status: DC | PRN

## 2021-09-27 MED ORDER — WITCH HAZEL-GLYCERIN EX PADS: 1.0000 "application " | MEDICATED_PAD | CUTANEOUS | Status: DC | PRN

## 2021-09-27 NOTE — Progress Notes (Signed)
Postop Day  1  Subjective: no complaints and tolerating PO  Doing well, no concerns. Pain managed with PO/IV meds, tolerating clear diet, advancing to regular. Foley remains in situ with clear yellow urine.    No fever/chills, chest pain, shortness of breath, nausea/vomiting, or leg pain. No nipple or breast pain. No headache, visual changes, or RUQ/epigastric pain.  Objective: BP (!) 103/55 (BP Location: Left Arm)   Pulse 67   Temp (!) 97.5 F (36.4 C) (Oral)   Resp 18   Ht '5\' 4"'  (1.626 m)   Wt 128.8 kg   LMP 11/02/2020 (Approximate)   SpO2 100%   Breastfeeding Unknown   BMI 48.75 kg/m    Physical Exam:  General: alert, cooperative, and no distress Breasts: soft/nontender CV: RRR Pulm: nl effort, CTABL Abdomen: soft, non-tender, active bowel sounds Uterine Fundus: firm Incision: no significant drainage Perineum: minimal edema, intact Lochia: appropriate DVT Evaluation: No evidence of DVT seen on physical exam.  Recent Labs    09/26/21 0052 09/27/21 0710  HGB 10.5* 8.8*  HCT 31.9* 27.1*  WBC 12.3* 13.7*  PLT 246 196    Assessment/Plan: 26 y.o. G1P1001 postpartum day # 1  -Continue routine postpartum care -Lactation consult PRN for breastfeeding  -Acute blood loss anemia - hemodynamically stable and asymptomatic; start PO ferrous sulfate BID with stool softeners  -Immunization status:   needs MMR and Varicella prior to discharge  -Urine output low and technically adequate.  Oxytocin discontinued and LR IV fluids started.  Instructed Victoria Sutton to change positions frequently in bed. Will monitor urine output closely.  Remove foley cath when appropriate.   -Plan to remove dressing after 24 hours and replace with OP Site -Up out of bed today, ambulation with assist, increase activity as tolerated    Disposition: Continue inpatient postpartum care    LOS: 1 day   Minda Meo, CNM 09/27/2021, 9:10 AM   ----- Victoria Sutton  Certified Nurse Midwife [facility]

## 2021-09-27 NOTE — Op Note (Signed)
NAMENAYDELINE, MORACE MEDICAL RECORD NO: [id number] ACCOUNT NO: [id number] DATE OF BIRTH: [birth year] FACILITY: [facility] LOCATION: [facility] PHYSICIAN: Suzy Bouchard, MD  Operative Report   DATE OF PROCEDURE: 09/26/2021  PREOPERATIVE DIAGNOSES: 1.  40+0 weeks estimated gestational age. 2.  Active phase arrest.  POSTOPERATIVE DIAGNOSES: 1.  40+0 weeks estimated gestational age. 2.  Active phase arrest. 3.  Vigorous female, delivered.  PROCEDURE:  Primary low transverse cesarean section.  ANESTHESIA:  Surgical dosing of continuous lumbar epidural.  SURGEON:  Suzy Bouchard, MD  FIRST ASSISTANT:  Heloise Ochoa, certified nurse midwife.  INDICATIONS:  A 26 year old gravida 1, para 0, patient at 40+0 weeks estimated gestational age.  She underwent an induction of labor and she progressed to 6 cm.  Despite rupture of membranes and adequate contraction pattern on Pitocin for 5 hours, the  patient did not change her cervix past 6 cm.  DESCRIPTION OF PROCEDURE:  After adequate surgical dosing of continuous lumbar epidural, the patient was placed in dorsal supine position, hip roll on the right side.  The patient's abdomen was prepped and draped in normal sterile fashion.  The patient  did receive 3 grams IV Ancef and 500 mg azithromycin for surgical prophylaxis.  Timeout was performed.  A Pfannenstiel incision was made 2 fingerbreadths above the symphysis pubis.  Sharp dissection was used to identify the fascia.  Fascia was opened in  the midline and opened in a transverse fashion.  Superior aspect of the fascia was grasped with Kocher clamps and the recti muscles were dissected free.  Inferior aspect of the fascia was grasped with Kocher clamps and pyramidalis muscle was dissected  free.  Entry into the peritoneal cavity was accomplished sharply.  Vesicouterine peritoneal fold was identified and bladder flap was created and the bladder was reflected inferiorly.  Low transverse  uterine incision was made.  Upon entry into the  endometrial cavity, clear fluid resulted.  A somewhat asynclitic wedged head with caput was elevated through the incision and a Kiwi vacuum was applied to the occiput and with 1 pull, the head was delivered and the vacuum was removed.  Shoulders were  then delivered followed by body without difficulty.  A large female was then dried on the mother's abdomen for 60 seconds.  Cord was clamped and vigorous female was passed to the nursery staff who assigned Apgar scores of 9 and 9.  Fetal weight 8 pounds 10  ounces.  Placenta was manually delivered and the uterus was exteriorized.  The endometrial cavity was wiped clean with laparotomy tape.  Uterine incision was closed with 1 chromic suture in a running locking fashion, good approximation of edges.  Two  additional figure-of-eight sutures required for hemostasis.  Fallopian tubes and ovaries appeared normal.  Posterior cul-de-sac was irrigated and suctioned.  Uterus was placed back into the abdominal cavity.  The paracolic gutters were wiped clean with  laparotomy tape.  Uterine incision again appeared hemostatic.  The fascia was then closed with 0 Vicryl suture in a running nonlocking fashion, two separate sutures were used.  Fascial edges were then injected with a solution of 60 mL of 0.5% Marcaine  plus 20 mL normal saline.  45 mL of this solution was injected.  Subcutaneous tissues were irrigated and bovied for hemostasis and given the depth of the subcutaneous tissues of 6 cm, the dead space was closed with a 2-0 chromic suture.  The skin was  reapproximated with Insorb absorbable staples and additional 30 mL of  Marcaine solution injected beneath the skin.  There were no complications.  QUANTITATIVE BLOOD LOSS:  585 mL  INTRAOPERATIVE FLUIDS:  800 mL  URINE OUTPUT:  25 mL  The patient was given 30 mg intravenous Toradol at the end of the case and was taken to recovery room in good condition.   VAI D:  09/26/2021 11:34:06 pm T: 09/27/2021 12:41:00 am  JOB: [id number]/ [id number]

## 2021-09-27 NOTE — Lactation Note (Signed)
This note was copied from a baby's chart. Lactation Consultation Note  Patient Name: Victoria Sutton [id number] Date: 09/27/2021 Reason for consult: Initial assessment;Primapara;Term Age:26 hours  Maternal Data Does the patient have breastfeeding experience prior to this delivery?: No  Mom voices possible desire to pump and bottle feed. Feels uncomfortable with positioning and ensuring transfer.  Feeding Mother's Current Feeding Choice: Breast Milk and Formula Nipple Type: Slow - flow  Baby has been primarily bottle feeding with short duration breastfeeds. Initially mom voiced wanting to bottle feed after moving to [facility]. During visit with mom, mom voices being uncomfortable with positioning and initially wanting to pump+bottle feed once she was at home. Pump set-up was offered today, but mom declined and asked for it to be done tomorrow (6/4).  LATCH Score    Lactation Tools Discussed/Used Tools:  (possibly may want to pump tomorrow)  Interventions Interventions: Breast feeding basics reviewed;Hand express;Education;DEBP  Reviewed tips for good position and latch. Reviewed deep latch and signs of transfer, and tips for feeding overnight if family wanted to try.  Discussed infants stomach size and appropriate volumes for bottle feedings and paced feeding technique.  Discharge Pump: Personal  Consult Status Consult Status: Follow-up Date: 09/28/21 Follow-up type: In-patient    Lavonia Drafts 09/27/2021, 4:29 PM

## 2021-09-27 NOTE — Anesthesia Post-op Follow-up Note (Signed)
  Anesthesia Pain Follow-up Note  Patient: Victoria Sutton  Day #: 1  Date of Follow-up: 09/27/2021 Time: 5:17 PM  Last Vitals:  Vitals:   09/27/21 1131 09/27/21 1558  BP: 102/61 107/65  Pulse: 81 77  Resp: 20 18  Temp: 36.7 C 36.7 C  SpO2: 100% 99%    Level of Consciousness: alert  Pain: mild   Side Effects:None  Catheter Site Exam:clean  Anti-Coag Meds (From admission, onward)   Start     Dose/Rate Route Frequency Ordered Stop   09/27/21 1200  enoxaparin (LOVENOX) injection 40 mg        40 mg Subcutaneous Every 24 hours 09/27/21 0620         Plan: Continue current therapy of postop epidural at surgeon's request  Milinda Pointer

## 2021-09-27 NOTE — Anesthesia Postprocedure Evaluation (Signed)
Anesthesia Post Note  Patient: Designer, jewellery  Procedure(s) Performed: CESAREAN SECTION  Patient location during evaluation: Mother Baby Anesthesia Type: Epidural Level of consciousness: awake Pain management: pain level controlled Vital Signs Assessment: vitals unstable and post-procedure vital signs reviewed and stable Respiratory status: spontaneous breathing Cardiovascular status: blood pressure returned to baseline and stable Postop Assessment: no headache and no backache Anesthetic complications: no   No notable events documented.   Last Vitals:  Vitals:   09/27/21 1131 09/27/21 1558  BP: 102/61 107/65  Pulse: 81 77  Resp: 20 18  Temp: 36.7 C 36.7 C  SpO2: 100% 99%    Last Pain:  Vitals:   09/27/21 1131  TempSrc: Oral  PainSc:                  Lannette Donath

## 2021-09-28 LAB — RHOGAM INJECTION: Unit division: 0

## 2021-09-28 MED ORDER — OXYCODONE HCL 5 MG PO TABS: 5.0000 mg | ORAL_TABLET | ORAL | 0 refills | Status: AC | PRN

## 2021-09-28 MED ORDER — FERROUS SULFATE 325 (65 FE) MG PO TABS
325.0000 mg | ORAL_TABLET | Freq: Every day | ORAL | 3 refills | Status: AC
Start: 2021-09-28 — End: 2022-09-28

## 2021-09-28 MED ORDER — IBUPROFEN 600 MG PO TABS: 600.0000 mg | ORAL_TABLET | Freq: Four times a day (QID) | ORAL | 1 refills | Status: AC | PRN

## 2021-09-28 MED ORDER — ACETAMINOPHEN 500 MG PO TABS: 1000.0000 mg | ORAL_TABLET | Freq: Four times a day (QID) | ORAL | 0 refills | Status: AC | PRN

## 2021-09-28 NOTE — Discharge Instructions (Signed)
Cesarean Delivery, Care After Refer to this sheet in the next few weeks. These instructions provide you with information on caring for yourself after your procedure. Your health care provider may also give you specific instructions. Your treatment has been planned according to current medical practices, but problems sometimes occur. Call your health care provider if you have any problems or questions after you go home. HOME CARE INSTRUCTIONS  Please leave honey comb dressing (OP Site) on for 7 days.  You may shower during this period but turn your back to the water so that the dressing does not get directly saturated by the water.   You may take the dressing off on day 7.  The easiest way to do it is in the shower.  Allow the water to run over the dressing and it usually comes off easier.   Only take over-the-counter or prescription medications as directed by your health care provider. Do not drink alcohol, especially if you are breastfeeding or taking medication to relieve pain. Do not  smoke tobacco. Continue to use good perineal care. Good perineal care includes: Wiping your perineum from front to back. Keeping your perineum clean. Check your surgical cut (incision) daily for increased redness, drainage, swelling, or separation of skin. Shower and clean your incision gently with soap and water every day, by letting warm and soapy water run over the incision, and then pat it dry. If your health care provider says it is okay, leave the incision uncovered. Use a bandage (dressing) if the incision is draining fluid or appears irritated. If the adhesive strips across the incision do not fall off within 7 days, carefully peel them off, after a shower. Hug a pillow when coughing or sneezing until your incision is healed. This helps to relieve pain. Do not use tampons, douches or have sexual intercourse, until your health care provider says it is okay. Wear a well-fitting bra that provides breast  support. Limit wearing support panties or control-top hose. Drink enough fluids to keep your urine clear or pale yellow. Eat high-fiber foods such as whole grain cereals and breads, brown rice, beans, and fresh fruits and vegetables every day. These foods may help prevent or relieve constipation. Resume activities such as climbing stairs, driving, lifting, exercising, or traveling as directed by your health care provider. Try to have someone help you with your household activities and your newborn for at least a few days after you leave the hospital. Rest as much as possible. Try to rest or take a nap when your newborn is sleeping. Increase your activities gradually. Do not lift more than 15lbs until directed by a provider. Keep all of your scheduled postpartum appointments. It is very important to keep your scheduled follow-up appointments. At these appointments, your health care provider will be checking to make sure that you are healing physically and emotionally. SEEK MEDICAL CARE IF:  You are passing large clots from your vagina. Save any clots to show your health care provider. You have a foul smelling discharge from your vagina. You have trouble urinating. You are urinating frequently. You have pain when you urinate. You have a change in your bowel movements. You have increasing redness, pain, or swelling near your incision. You have pus draining from your incision. Your incision is separating. You have painful, hard, or reddened breasts. You have a severe headache. You have blurred vision or see spots. You feel sad or depressed. You have thoughts of hurting yourself or your newborn. You have questions   about your care, the care of your newborn, or medications. You are dizzy or light-headed. You have a rash. You have pain, redness, or swelling at the site of the removed intravenous access (IV) tube. You have nausea or vomiting. You stopped breastfeeding and have not had a  menstrual period within 12 weeks of stopping. You are not breastfeeding and have not had a menstrual period within 12 weeks of delivery. You have a fever. SEEK IMMEDIATE MEDICAL CARE IF: You have persistent pain. You have chest pain. You have shortness of breath. You faint. You have leg pain. You have stomach pain. Your vaginal bleeding saturates 2 or more sanitary pads in 1 hour. MAKE SURE YOU:  Understand these instructions. Will watch your condition. Will get help right away if you are not doing well or get worse. Document Released: 01/03/2002 Document Revised: 08/28/2013 Document Reviewed: 12/09/2011 ExitCare Patient Information 2015 [employer]. This information is not intended to replace advice given to you by your health care provider. Make sure you discuss any questions you have with your health care provider.  

## 2021-09-28 NOTE — Lactation Note (Signed)
This note was copied from a baby's chart. Lactation Consultation Note  Patient Name: Victoria Sutton [id number] Date: 09/28/2021 Reason for consult: Follow-up assessment;Primapara;Term Age:26 hours  Maternal Data  This is mom's first baby. On follow-up today mom requests to initiate breastpumping. She has offered baby to breastfeed and he latches but doesn't actively suck. Mom with noted compression stripe on left nipple. Mom's plan at this time is to breast and bottle feed.   Has patient been taught Hand Expression?: Yes Does the patient have breastfeeding experience prior to this delivery?: No  Feeding Mother's Current Feeding Choice: Breast Milk and Formula  Lactation Tools Discussed/Used Tools: Pump (Mother requested to use a DEBP) Breast pump type: Double-Electric Breast Pump Pump Education: Setup, frequency, and cleaning;Milk Storage Reason for Pumping: Mother's request Pumping frequency: PRN if baby won't latch or complete a feed at the breast or by mother's choice in place of a breastfeeding. Pumped volume: 1 mL  Interventions Interventions: Breast feeding basics reviewed;Breast massage;Hand express;Hand pump;Coconut oil;DEBP Discussed importance of establishing milk supply if mom plans to breastfeed and/or pump and provide milk.Discussed benefits of breastmilk as part of her breast/bottle plan. Recommended if she wants to maximize production to pump if baby will not actively feed. Provided tips and strategies to encourage active feeding.  Discharge Discharge Education: Engorgement and breast care;Warning signs for feeding baby;Other (comment) (Mom understands she can return outpatient to lactation if she has additional needs with breastfeeding.) Pump: Personal;Manual (Mom has a Motiff double electric pump for home use.Also, has the Harmony manual pump received today.)  Consult Status Consult Status: Complete Date: 09/28/21 Follow-up type: In-patient    Fuller Song 09/28/2021, 12:01 PM

## 2021-09-28 NOTE — TOC Initial Note (Addendum)
Transition of Care [facility]) - Initial/Assessment Note    Patient Details  Name: Victoria Sutton MRN: [id number] Date of Birth: 03/12/1996  Transition of [facility]) CM/SW Contact:    Janyth Contes, Lane Phone Number: 09/28/2021, 10:07 AM  Clinical Narrative:                  [facility] consult for high Edinburgh score. CSW spoke to patient and patient reported history of depression and anxiety. Patient reported being on Zoloft and having mental health services in place. Patient reported having PCP and plans for [facility] for the newborn. Patient reported having strong support at home and having essential baby items. CSW reviewed PPD symptoms and patient acknowledged understanding.   No further TOC needs at this time.        Patient Goals and CMS Choice        Expected Discharge Plan and Services           Expected Discharge Date: 09/28/21                                    Prior Living Arrangements/Services                       Activities of Daily Living Home Assistive Devices/Equipment: None ADL Screening (condition at time of admission) Patient's cognitive ability adequate to safely complete daily activities?: Yes Is the patient deaf or have difficulty hearing?: No Does the patient have difficulty seeing, even when wearing glasses/contacts?: No Does the patient have difficulty concentrating, remembering, or making decisions?: No Patient able to express need for assistance with ADLs?: Yes Does the patient have difficulty dressing or bathing?: No Independently performs ADLs?: Yes (appropriate for developmental age) Does the patient have difficulty walking or climbing stairs?: No Weakness of Legs: None Weakness of Arms/Hands: None  Permission Sought/Granted                  Emotional Assessment              Admission diagnosis:  Encounter for induction of labor [Z34.90] Patient Active Problem List   Diagnosis Date Noted   Uterine  size-date discrepancy in third trimester 09/27/2021   Encounter for induction of labor 09/26/2021   Rubella non-immune status, antepartum 09/03/2021   Susceptible to varicella (non-immune), currently pregnant 09/03/2021   Obesity affecting pregnancy in third trimester 06/18/2021   Rh negative status during pregnancy in second trimester 06/18/2021   Encounter for supervision of normal first pregnancy in third trimester 02/14/2021   Moderate episode of recurrent major depressive disorder ([place]) 02/06/2014   PCP:  Tillie Fantasia, MD Pharmacy:   [facility] [address], [place] - [place] [address] [address] Phone: [phone] Fax: [phone]  [place] [id number] [place] [address] Phone: [phone] Fax: [phone]     Social Determinants of Health (SDOH) Interventions    Readmission Risk Interventions     View : No data to display.

## 2021-09-28 NOTE — Progress Notes (Signed)
Reviewed D/C instructions with pt and family. Pt verbalized understanding of teaching. Discharged to home via W/C. Pt to schedule f/u appt.  

## 2021-09-29 ENCOUNTER — Encounter: Payer: Self-pay | Admitting: Obstetrics and Gynecology

## 2021-09-30 DIAGNOSIS — R69 Illness, unspecified: Secondary | ICD-10-CM | POA: Diagnosis not present

## 2021-09-30 DIAGNOSIS — F418 Other specified anxiety disorders: Secondary | ICD-10-CM | POA: Diagnosis not present

## 2021-10-02 ENCOUNTER — Other Ambulatory Visit: Payer: Self-pay

## 2021-10-02 ENCOUNTER — Emergency Department: Payer: 59

## 2021-10-02 ENCOUNTER — Inpatient Hospital Stay
Admission: EM | Admit: 2021-10-02 | Discharge: 2021-10-05 | DRG: 776 | Disposition: A | Discharge status: Home / Self Care | Payer: 59 | Attending: Obstetrics and Gynecology | Admitting: Obstetrics and Gynecology

## 2021-10-02 DIAGNOSIS — O1495 Unspecified pre-eclampsia, complicating the puerperium: Secondary | ICD-10-CM | POA: Diagnosis present

## 2021-10-02 DIAGNOSIS — I110 Hypertensive heart disease with heart failure: Secondary | ICD-10-CM | POA: Diagnosis present

## 2021-10-02 DIAGNOSIS — O99345 Other mental disorders complicating the puerperium: Secondary | ICD-10-CM | POA: Diagnosis present

## 2021-10-02 DIAGNOSIS — R0602 Shortness of breath: Principal | ICD-10-CM

## 2021-10-02 DIAGNOSIS — O115 Pre-existing hypertension with pre-eclampsia, complicating the puerperium: Secondary | ICD-10-CM | POA: Diagnosis not present

## 2021-10-02 DIAGNOSIS — F514 Sleep terrors [night terrors]: Secondary | ICD-10-CM | POA: Diagnosis present

## 2021-10-02 DIAGNOSIS — F17210 Nicotine dependence, cigarettes, uncomplicated: Secondary | ICD-10-CM | POA: Diagnosis not present

## 2021-10-02 DIAGNOSIS — I509 Heart failure, unspecified: Secondary | ICD-10-CM | POA: Diagnosis not present

## 2021-10-02 DIAGNOSIS — Z6841 Body Mass Index (BMI) 40.0 and over, adult: Secondary | ICD-10-CM

## 2021-10-02 DIAGNOSIS — F419 Anxiety disorder, unspecified: Secondary | ICD-10-CM | POA: Diagnosis present

## 2021-10-02 DIAGNOSIS — O1013 Pre-existing hypertensive heart disease complicating the puerperium: Secondary | ICD-10-CM | POA: Diagnosis present

## 2021-10-02 DIAGNOSIS — O99215 Obesity complicating the puerperium: Secondary | ICD-10-CM | POA: Diagnosis not present

## 2021-10-02 DIAGNOSIS — G4700 Insomnia, unspecified: Secondary | ICD-10-CM | POA: Diagnosis present

## 2021-10-02 DIAGNOSIS — J90 Pleural effusion, not elsewhere classified: Secondary | ICD-10-CM | POA: Diagnosis not present

## 2021-10-02 DIAGNOSIS — O1415 Severe pre-eclampsia, complicating the puerperium: Secondary | ICD-10-CM | POA: Diagnosis not present

## 2021-10-02 DIAGNOSIS — I10 Essential (primary) hypertension: Secondary | ICD-10-CM | POA: Diagnosis not present

## 2021-10-02 DIAGNOSIS — F32A Depression, unspecified: Secondary | ICD-10-CM | POA: Diagnosis present

## 2021-10-02 DIAGNOSIS — O99335 Smoking (tobacco) complicating the puerperium: Secondary | ICD-10-CM | POA: Diagnosis not present

## 2021-10-02 DIAGNOSIS — R7989 Other specified abnormal findings of blood chemistry: Secondary | ICD-10-CM | POA: Diagnosis not present

## 2021-10-02 DIAGNOSIS — R69 Illness, unspecified: Secondary | ICD-10-CM | POA: Diagnosis not present

## 2021-10-02 LAB — HEPATIC FUNCTION PANEL
ALT: 82 U/L — ABNORMAL HIGH (ref 0–44)
AST: 55 U/L — ABNORMAL HIGH (ref 15–41)
Albumin: 2.7 g/dL — ABNORMAL LOW (ref 3.5–5.0)
Alkaline Phosphatase: 108 U/L (ref 38–126)
Bilirubin, Direct: 0.1 mg/dL (ref 0.0–0.2)
Indirect Bilirubin: 0.3 mg/dL (ref 0.3–0.9)
Total Bilirubin: 0.4 mg/dL (ref 0.3–1.2)
Total Protein: 6.3 g/dL — ABNORMAL LOW (ref 6.5–8.1)

## 2021-10-02 LAB — URINALYSIS, COMPLETE (UACMP) WITH MICROSCOPIC
Bilirubin Urine: NEGATIVE
Glucose, UA: NEGATIVE mg/dL
Ketones, ur: NEGATIVE mg/dL
Nitrite: NEGATIVE
Protein, ur: NEGATIVE mg/dL
Specific Gravity, Urine: 1.042 — ABNORMAL HIGH (ref 1.005–1.030)
pH: 6 (ref 5.0–8.0)

## 2021-10-02 LAB — COMPREHENSIVE METABOLIC PANEL
ALT: 100 U/L — ABNORMAL HIGH (ref 0–44)
AST: 63 U/L — ABNORMAL HIGH (ref 15–41)
Albumin: 3.2 g/dL — ABNORMAL LOW (ref 3.5–5.0)
Alkaline Phosphatase: 127 U/L — ABNORMAL HIGH (ref 38–126)
Anion gap: 9 (ref 5–15)
BUN: 17 mg/dL (ref 6–20)
CO2: 25 mmol/L (ref 22–32)
Calcium: 9.1 mg/dL (ref 8.9–10.3)
Chloride: 106 mmol/L (ref 98–111)
Creatinine, Ser: 0.99 mg/dL (ref 0.44–1.00)
GFR, Estimated: >60 mL/min (ref >60)
Glucose, Bld: 91 mg/dL (ref 70–99)
Potassium: 3.6 mmol/L (ref 3.5–5.1)
Sodium: 140 mmol/L (ref 135–145)
Total Bilirubin: 0.7 mg/dL (ref 0.3–1.2)
Total Protein: 7.5 g/dL (ref 6.5–8.1)

## 2021-10-02 LAB — CBC
HCT: 28.5 % — ABNORMAL LOW (ref 36.0–46.0)
HCT: 33.1 % — ABNORMAL LOW (ref 36.0–46.0)
Hemoglobin: 9 g/dL — ABNORMAL LOW (ref 12.0–15.0)
Hemoglobin: 10.7 g/dL — ABNORMAL LOW (ref 12.0–15.0)
MCH: 27 pg (ref 26.0–34.0)
MCH: 27.1 pg (ref 26.0–34.0)
MCHC: 31.6 g/dL (ref 30.0–36.0)
MCHC: 32.3 g/dL (ref 30.0–36.0)
MCV: 85.6 fL (ref 80.0–100.0)
MCV: 83.8 fL (ref 80.0–100.0)
Platelets: 327 10*3/uL (ref 150–400)
Platelets: 365 10*3/uL (ref 150–400)
RBC: 3.33 MIL/uL — ABNORMAL LOW (ref 3.87–5.11)
RBC: 3.95 MIL/uL (ref 3.87–5.11)
RDW: 14.6 % (ref 11.5–15.5)
RDW: 14.6 % (ref 11.5–15.5)
WBC: 11.5 10*3/uL — ABNORMAL HIGH (ref 4.0–10.5)
WBC: 11.4 10*3/uL — ABNORMAL HIGH (ref 4.0–10.5)
nRBC: 0.2 % (ref 0.0–0.2)
nRBC: 0.2 % (ref 0.0–0.2)

## 2021-10-02 LAB — BASIC METABOLIC PANEL
Anion gap: 9 (ref 5–15)
BUN: 18 mg/dL (ref 6–20)
CO2: 23 mmol/L (ref 22–32)
Calcium: 8.9 mg/dL (ref 8.9–10.3)
Chloride: 109 mmol/L (ref 98–111)
Creatinine, Ser: 0.88 mg/dL (ref 0.44–1.00)
GFR, Estimated: >60 mL/min (ref >60)
Glucose, Bld: 97 mg/dL (ref 70–99)
Potassium: 4 mmol/L (ref 3.5–5.1)
Sodium: 141 mmol/L (ref 135–145)

## 2021-10-02 LAB — SAMPLE TO BLOOD BANK

## 2021-10-02 LAB — PROTIME-INR
INR: 1.1 (ref 0.8–1.2)
Prothrombin Time: 13.6 seconds (ref 11.4–15.2)

## 2021-10-02 LAB — PROTEIN / CREATININE RATIO, URINE
Creatinine, Urine: 76 mg/dL
Protein Creatinine Ratio: 0.53 mg/mg{Cre} — ABNORMAL HIGH (ref 0.00–0.15)
Total Protein, Urine: 40 mg/dL

## 2021-10-02 LAB — BRAIN NATRIURETIC PEPTIDE: B Natriuretic Peptide: 712.1 pg/mL — ABNORMAL HIGH (ref 0.0–100.0)

## 2021-10-02 LAB — TROPONIN I (HIGH SENSITIVITY)
Troponin I (High Sensitivity): 31 ng/L — ABNORMAL HIGH (ref <18)
Troponin I (High Sensitivity): 27 ng/L — ABNORMAL HIGH (ref <18)

## 2021-10-02 MED ORDER — HYDRALAZINE HCL 20 MG/ML IJ SOLN: 5.0000 mg | INTRAMUSCULAR | Status: DC | PRN

## 2021-10-02 MED ORDER — OXYCODONE HCL 5 MG PO TABS: 5.0000 mg | ORAL_TABLET | ORAL | Status: DC | PRN

## 2021-10-02 MED ORDER — LABETALOL HCL 5 MG/ML IV SOLN: 20.0000 mg | INTRAVENOUS | Status: DC | PRN

## 2021-10-02 MED ORDER — MENTHOL 3 MG MT LOZG: 1.0000 | LOZENGE | OROMUCOSAL | Status: DC | PRN

## 2021-10-02 MED ORDER — BISACODYL 10 MG RE SUPP: 10.0000 mg | Freq: Every day | RECTAL | Status: DC | PRN

## 2021-10-02 MED ORDER — MAGNESIUM SULFATE BOLUS VIA INFUSION
4.0000 g | Freq: Once | INTRAVENOUS | Status: AC
  Administered 2021-10-02: 4 g via INTRAVENOUS
  Filled 2021-10-02: qty 1000

## 2021-10-02 MED ORDER — HYDRALAZINE HCL 20 MG/ML IJ SOLN: 10.0000 mg | INTRAMUSCULAR | Status: DC | PRN

## 2021-10-02 MED ORDER — CALCIUM GLUCONATE 10 % IV SOLN
INTRAVENOUS | Status: AC
  Filled 2021-10-02: qty 10

## 2021-10-02 MED ORDER — SENNOSIDES-DOCUSATE SODIUM 8.6-50 MG PO TABS
2.0000 | ORAL_TABLET | ORAL | Status: DC
  Administered 2021-10-02: 2 via ORAL
  Filled 2021-10-02 (×2): qty 2

## 2021-10-02 MED ORDER — SIMETHICONE 80 MG PO CHEW: 80.0000 mg | CHEWABLE_TABLET | ORAL | Status: DC | PRN

## 2021-10-02 MED ORDER — IOHEXOL 350 MG/ML SOLN
75.0000 mL | Freq: Once | INTRAVENOUS | Status: AC | PRN
  Administered 2021-10-02: 75 mL via INTRAVENOUS

## 2021-10-02 MED ORDER — IBUPROFEN 600 MG PO TABS: 600.0000 mg | ORAL_TABLET | Freq: Four times a day (QID) | ORAL | Status: DC | PRN

## 2021-10-02 MED ORDER — FUROSEMIDE 10 MG/ML IJ SOLN
20.0000 mg | Freq: Two times a day (BID) | INTRAMUSCULAR | Status: DC
  Administered 2021-10-02 – 2021-10-03 (×3): 20 mg via INTRAVENOUS
  Filled 2021-10-02 (×3): qty 2

## 2021-10-02 MED ORDER — FERROUS SULFATE 325 (65 FE) MG PO TABS
325.0000 mg | ORAL_TABLET | Freq: Two times a day (BID) | ORAL | Status: DC
  Administered 2021-10-02 – 2021-10-05 (×6): 325 mg via ORAL
  Filled 2021-10-02 (×6): qty 1

## 2021-10-02 MED ORDER — LABETALOL HCL 5 MG/ML IV SOLN: 40.0000 mg | INTRAVENOUS | Status: DC | PRN

## 2021-10-02 MED ORDER — DIPHENHYDRAMINE HCL 25 MG PO CAPS: 25.0000 mg | ORAL_CAPSULE | Freq: Four times a day (QID) | ORAL | Status: DC | PRN

## 2021-10-02 MED ORDER — ENOXAPARIN SODIUM 80 MG/0.8ML IJ SOSY
0.5000 mg/kg | PREFILLED_SYRINGE | INTRAMUSCULAR | Status: DC
  Administered 2021-10-02 – 2021-10-04 (×3): 65 mg via SUBCUTANEOUS
  Filled 2021-10-02 (×4): qty 0.65

## 2021-10-02 MED ORDER — SIMETHICONE 80 MG PO CHEW
80.0000 mg | CHEWABLE_TABLET | Freq: Three times a day (TID) | ORAL | Status: DC
  Administered 2021-10-02 – 2021-10-05 (×7): 80 mg via ORAL
  Filled 2021-10-02 (×7): qty 1

## 2021-10-02 MED ORDER — AMLODIPINE BESYLATE 5 MG PO TABS
5.0000 mg | ORAL_TABLET | Freq: Every day | ORAL | Status: DC
  Administered 2021-10-02 – 2021-10-05 (×4): 5 mg via ORAL
  Filled 2021-10-02 (×4): qty 1

## 2021-10-02 MED ORDER — DIBUCAINE (PERIANAL) 1 % EX OINT: 1.0000 "application " | TOPICAL_OINTMENT | CUTANEOUS | Status: DC | PRN

## 2021-10-02 MED ORDER — POTASSIUM CHLORIDE CRYS ER 10 MEQ PO TBCR
40.0000 meq | EXTENDED_RELEASE_TABLET | Freq: Once | ORAL | Status: AC
  Administered 2021-10-02: 40 meq via ORAL
  Filled 2021-10-02: qty 4

## 2021-10-02 MED ORDER — FLEET ENEMA 7-19 GM/118ML RE ENEM: 1.0000 | ENEMA | Freq: Every day | RECTAL | Status: DC | PRN

## 2021-10-02 MED ORDER — ACETAMINOPHEN 500 MG PO TABS: 1000.0000 mg | ORAL_TABLET | Freq: Four times a day (QID) | ORAL | Status: DC | PRN

## 2021-10-02 MED ORDER — COCONUT OIL OIL: 1.0000 "application " | TOPICAL_OIL | Status: DC | PRN

## 2021-10-02 MED ORDER — WITCH HAZEL-GLYCERIN EX PADS: 1.0000 "application " | MEDICATED_PAD | CUTANEOUS | Status: DC | PRN

## 2021-10-02 MED ORDER — FUROSEMIDE 10 MG/ML IJ SOLN
20.0000 mg | Freq: Every day | INTRAMUSCULAR | Status: DC
  Filled 2021-10-02: qty 2

## 2021-10-02 MED ORDER — BUSPIRONE HCL 15 MG PO TABS
7.5000 mg | ORAL_TABLET | Freq: Two times a day (BID) | ORAL | Status: DC
  Administered 2021-10-02 – 2021-10-05 (×6): 7.5 mg via ORAL
  Filled 2021-10-02: qty 1.5
  Filled 2021-10-02 (×4): qty 1
  Filled 2021-10-02: qty 1.5

## 2021-10-02 MED ORDER — LABETALOL HCL 5 MG/ML IV SOLN: 80.0000 mg | INTRAVENOUS | Status: DC | PRN

## 2021-10-02 MED ORDER — SERTRALINE HCL 25 MG PO TABS
150.0000 mg | ORAL_TABLET | Freq: Every day | ORAL | Status: DC
  Administered 2021-10-03 – 2021-10-05 (×3): 150 mg via ORAL
  Filled 2021-10-02: qty 3
  Filled 2021-10-02: qty 2
  Filled 2021-10-02: qty 3
  Filled 2021-10-02: qty 2

## 2021-10-02 MED ORDER — MAGNESIUM SULFATE 40 GM/1000ML IV SOLN
2.0000 g/h | INTRAVENOUS | Status: DC
  Filled 2021-10-02 (×2): qty 1000

## 2021-10-02 MED ORDER — FERROUS SULFATE 325 (65 FE) MG PO TABS: 325.0000 mg | ORAL_TABLET | Freq: Every day | ORAL | Status: DC

## 2021-10-02 MED ORDER — ACETAMINOPHEN 500 MG PO TABS
1000.0000 mg | ORAL_TABLET | Freq: Four times a day (QID) | ORAL | Status: DC | PRN
  Administered 2021-10-04: 1000 mg via ORAL
  Filled 2021-10-02: qty 2

## 2021-10-02 MED ORDER — LACTATED RINGERS IV SOLN: INTRAVENOUS | Status: DC

## 2021-10-02 MED ORDER — PRENATAL MULTIVITAMIN CH
1.0000 | ORAL_TABLET | Freq: Every day | ORAL | Status: DC
  Administered 2021-10-03 – 2021-10-04 (×2): 1 via ORAL
  Filled 2021-10-02 (×2): qty 1

## 2021-10-02 NOTE — Assessment & Plan Note (Signed)
Continue buspirone and sertraline  

## 2021-10-02 NOTE — H&P (Signed)
History of Present Illness:   Chief Complaint: SOB and headache  HPI:  Victoria Sutton is a 26 y.o. 81P1001 female at 6 days postpartum who presented to the ED with complaints of SOB, swelling and headache.  She had an uncomplicated LTCS for active phase arrest on 09/26/21.  Victoria Sutton was discharged home in stable condition on 09/28/21 and did not require oral antihypertensives prior to discharge.  Pt was seen in clinic for increasing anxiety/depression and night terrors. She had been previously stable on Zoloft 100mg  daily, increased Zoloft to 150mg  daily and started Trazodone 50mg  qHS prn insomnia,  Buspar 7.5mg  BID on 6/6.   She reports the following symptoms of SOB, LE swelling and mild pressure like headache.  She was noted to have elevated blood pressures that did not require treatment, preeclampsia labs were  normal CBC, CMP with normal creatinine and elevated LFTs .  Urine P/C is pending. Also noted elevated troponin; and cards/hospitalist were consulted. Imaging done showing pulmonary edema  Decision made to admit for postpartum preeclampsia with severe features and magnesium sulfate infusion.    Headache: mild and feels like pressure in her head.  Changes in vision: denies RUQ pain: denies  Factors complicating antepartum care:  H/o mental health diagnoses BMI 48.7 Varicella and rubella non Immune- pt declined vaccine at discharge Rh Negative- given Rhogam on 09/27/21  Patient Active Problem List   Diagnosis Date Noted   Essential hypertension 10/02/2021   CHF (congestive heart failure) (HCC) 10/02/2021   Morbid obesity with BMI of 45.0-49.9, adult (HCC) 10/02/2021   Anxiety and depression 10/02/2021   Preeclampsia in postpartum period 10/02/2021   Uterine size-date discrepancy in third trimester 09/27/2021   Encounter for induction of labor 09/26/2021   Rubella non-immune status, antepartum 09/03/2021   Susceptible to varicella (non-immune), currently pregnant 09/03/2021   Obesity  affecting pregnancy in third trimester 06/18/2021   Rh negative status during pregnancy in second trimester 06/18/2021   Encounter for supervision of normal first pregnancy in third trimester 02/14/2021   Moderate episode of recurrent major depressive disorder (HCC) 02/06/2014     Maternal Medical History:   Past Medical History:  Diagnosis Date   Depression    Diabetes mellitus without complication (HCC)     Past Surgical History:  Procedure Laterality Date   CESAREAN SECTION  09/26/2021   Procedure: CESAREAN SECTION;  Surgeon: Schermerhorn, Ihor Austinhomas J, MD;  Location: [facility] ORS;  Service: Obstetrics;;   NO PAST SURGERIES      No Known Allergies  Prior to Admission medications   Medication Sig Start Date End Date Taking? Authorizing Provider  acetaminophen (TYLENOL) 500 MG tablet Take 2 tablets (1,000 mg total) by mouth every 6 (six) hours as needed. 09/28/21  Yes Gustavo LahMackie, Anna M, CNM  busPIRone (BUSPAR) 7.5 MG tablet Take 7.5 mg by mouth 2 (two) times daily.   Yes [provider]  ferrous sulfate 325 (65 FE) MG tablet Take 1 tablet (325 mg total) by mouth daily. 09/28/21 09/28/22 Yes Gustavo LahMackie, Anna M, CNM  ibuprofen (ADVIL) 600 MG tablet Take 1 tablet (600 mg total) by mouth every 6 (six) hours as needed for mild pain or cramping. 09/28/21  Yes Gustavo LahMackie, Anna M, CNM  ondansetron (ZOFRAN) 8 MG tablet Take by mouth every 8 (eight) hours as needed for nausea or vomiting.   Yes [provider]  sertraline (ZOLOFT) 100 MG tablet Take 150 mg by mouth daily.   Yes [provider]  oxyCODONE (OXY IR/ROXICODONE) 5  MG immediate release tablet Take 1 tablet (5 mg total) by mouth every 4 (four) hours as needed for up to 7 days for moderate pain. Patient not taking: Reported on 10/02/2021 09/28/21 10/05/21  Gustavo Lah, CNM  escitalopram (LEXAPRO) 5 MG tablet Take 15 mg by mouth daily.  03/01/20  [provider]  norethindrone-ethinyl estradiol-iron (ESTROSTEP FE,TILIA  FE,TRI-LEGEST FE) 1-20/1-30/1-35 MG-MCG tablet Take 1 tablet by mouth daily.  03/01/20  [provider]     Prenatal care site:  [facility]  OB History  Gravida Para Term Preterm AB Living  0 0 1  SAB IAB Ectopic Multiple Live Births  0 0 0 0 1    # Outcome Date GA Lbr Len/2nd Weight Sex Delivery Anes PTL Lv  1 Term 09/26/21 [redacted]w[redacted]d  3920 g M CS-Vac EPI  LIV     Name: Jake,BOY Chrystian     Apgar1: 9  Apgar5: 9     Social History: She  reports that she has been smoking cigarettes. She has never used smokeless tobacco. She reports that she does not drink alcohol and does not use drugs.  Family History: family history includes Diabetes in her father and mother; Hyperlipidemia in her father; Hypertension in her father and mother.   Review of Systems: A full review of systems was performed and negative except as noted in the HPI.     Physical Exam:  Vital Signs: BP 126/63 (BP Location: Left Arm)   Pulse (!) 58   Temp 98.5 F (36.9 C) (Oral)   Resp 16   Ht  (1.626 m)   Wt 126.6 kg   LMP 11/02/2020 (Approximate)   SpO2 97%   Breastfeeding Unknown   BMI 47.89 kg/m   Physical Exam Constitutional:      Appearance: She is well-developed. She is obese.  HENT:     Head: Normocephalic and atraumatic.  Cardiovascular:     Rate and Rhythm: Bradycardia present.  Pulmonary:     Effort: Pulmonary effort is normal.     Breath sounds: Normal breath sounds.  Abdominal:     General: Bowel sounds are normal.     Palpations: Abdomen is soft.     Comments: Pfannienstiel incision well approximated, no erythema or drainage, honeycomb dressing intact.   Musculoskeletal:        General: Normal range of motion.     Cervical back: Normal range of motion.  Skin:    General: Skin is warm and dry.     Capillary Refill: Capillary refill takes less than 2 seconds.  Neurological:     Mental Status: She is alert and oriented to person, place, and time.   Psychiatric:        Mood and Affect: Mood normal.        Behavior: Behavior normal.    Abdomen: soft, gravid, non-tender; fundus firm, U/3 Pelvic: deferred   Extremities: non-tender, symmetric, 2+ edema bilaterally.  DTRs: 2+, no clonus bilat    Results for orders placed or performed during the hospital encounter of 10/02/21 (from the past 24 hour(s))  Basic metabolic panel     Status: None   Collection Time: 10/02/21 10:40 AM  Result Value Ref Range   Sodium 141 135 - 145 mmol/L   Potassium 4.0 3.5 - 5.1 mmol/L   Chloride 109 98 - 111 mmol/L   CO2 23 22 - 32 mmol/L   Glucose, Bld 97 70 - 99 mg/dL   BUN  18 6 - 20 mg/dL   Creatinine, Ser 7.74 0.44 - 1.00 mg/dL   Calcium 8.9 8.9 - 12.8 mg/dL   GFR, Estimated >78 >67 mL/min   Anion gap 9 5 - 15  CBC     Status: Abnormal   Collection Time: 10/02/21 10:40 AM  Result Value Ref Range   WBC 11.5 (H) 4.0 - 10.5 K/uL   RBC 3.33 (L) 3.87 - 5.11 MIL/uL   Hemoglobin 9.0 (L) 12.0 - 15.0 g/dL   HCT 67.2 (L) 09.4 - 70.9 %   MCV 85.6 80.0 - 100.0 fL   MCH 27.0 26.0 - 34.0 pg   MCHC 31.6 30.0 - 36.0 g/dL   RDW 62.8 36.6 - 29.4 %   Platelets 327 150 - 400 K/uL   nRBC 0.2 0.0 - 0.2 %  Troponin I (High Sensitivity)     Status: Abnormal   Collection Time: 10/02/21 10:40 AM  Result Value Ref Range   Troponin I (High Sensitivity) 31 (H) <18 ng/L  Protime-INR (order if Patient is taking Coumadin / Warfarin)     Status: None   Collection Time: 10/02/21 10:40 AM  Result Value Ref Range   Prothrombin Time 13.6 11.4 - 15.2 seconds   INR 1.1 0.8 - 1.2  Sample to Blood Bank     Status: None   Collection Time: 10/02/21 10:40 AM  Result Value Ref Range   Blood Bank Specimen SAMPLE AVAILABLE FOR TESTING    Sample Expiration      10/05/2021,2359 Performed at [facility], [address] [place], [place] [address]   Hepatic function panel     Status: Abnormal   Collection Time: 10/02/21 10:46 AM  Result Value Ref Range   Total  Protein 6.3 (L) 6.5 - 8.1 g/dL   Albumin 2.7 (L) 3.5 - 5.0 g/dL   AST 55 (H) 15 - 41 U/L   ALT 82 (H) 0 - 44 U/L   Alkaline Phosphatase 108 38 - 126 U/L   Total Bilirubin 0.4 0.3 - 1.2 mg/dL   Bilirubin, Direct 0.1 0.0 - 0.2 mg/dL   Indirect Bilirubin 0.3 0.3 - 0.9 mg/dL  Urinalysis, Complete w Microscopic     Status: Abnormal   Collection Time: 10/02/21 11:41 AM  Result Value Ref Range   Color, Urine YELLOW (A) YELLOW   APPearance HAZY (A) CLEAR   Specific Gravity, Urine 1.042 (H) 1.005 - 1.030   pH 6.0 5.0 - 8.0   Glucose, UA NEGATIVE NEGATIVE mg/dL   Hgb urine dipstick LARGE (A) NEGATIVE   Bilirubin Urine NEGATIVE NEGATIVE   Ketones, ur NEGATIVE NEGATIVE mg/dL   Protein, ur NEGATIVE NEGATIVE mg/dL   Nitrite NEGATIVE NEGATIVE   Leukocytes,Ua SMALL (A) NEGATIVE   RBC / HPF 0-5 0 - 5 RBC/hpf   WBC, UA 21-50 0 - 5 WBC/hpf   Bacteria, UA RARE (A) NONE SEEN   Squamous Epithelial / LPF 0-5 0 - 5   Mucus PRESENT   Troponin I (High Sensitivity)     Status: Abnormal   Collection Time: 10/02/21 12:40 PM  Result Value Ref Range   Troponin I (High Sensitivity) 27 (H) <18 ng/L  Brain natriuretic peptide     Status: Abnormal   Collection Time: 10/02/21  3:52 PM  Result Value Ref Range   B Natriuretic Peptide 712.1 (H) 0.0 - 100.0 pg/mL  Protein / creatinine ratio, urine     Status: Abnormal   Collection Time: 10/02/21  3:55 PM  Result  Value Ref Range   Creatinine, Urine 76 mg/dL   Total Protein, Urine 40 mg/dL   Protein Creatinine Ratio 0.53 (H) 0.00 - 0.15 mg/mg[Cre]  Comprehensive metabolic panel     Status: Abnormal   Collection Time: 10/02/21  6:03 PM  Result Value Ref Range   Sodium 140 135 - 145 mmol/L   Potassium 3.6 3.5 - 5.1 mmol/L   Chloride 106 98 - 111 mmol/L   CO2 25 22 - 32 mmol/L   Glucose, Bld 91 70 - 99 mg/dL   BUN 17 6 - 20 mg/dL   Creatinine, Ser 8.18 0.44 - 1.00 mg/dL   Calcium 9.1 8.9 - 56.3 mg/dL   Total Protein 7.5 6.5 - 8.1 g/dL   Albumin 3.2 (L) 3.5 -  5.0 g/dL   AST 63 (H) 15 - 41 U/L   ALT 100 (H) 0 - 44 U/L   Alkaline Phosphatase 127 (H) 38 - 126 U/L   Total Bilirubin 0.7 0.3 - 1.2 mg/dL   GFR, Estimated >14 >97 mL/min   Anion gap 9 5 - 15  CBC     Status: Abnormal   Collection Time: 10/02/21  6:03 PM  Result Value Ref Range   WBC 11.4 (H) 4.0 - 10.5 K/uL   RBC 3.95 3.87 - 5.11 MIL/uL   Hemoglobin 10.7 (L) 12.0 - 15.0 g/dL   HCT 02.6 (L) 37.8 - 58.8 %   MCV 83.8 80.0 - 100.0 fL   MCH 27.1 26.0 - 34.0 pg   MCHC 32.3 30.0 - 36.0 g/dL   RDW 50.2 77.4 - 12.8 %   Platelets 365 150 - 400 K/uL   nRBC 0.2 0.0 - 0.2 %    Pertinent Results:  Prenatal Labs: Blood type/Rh A neg  Antibody screen neg  Rubella NON-Immune  Varicella NON-Immune  RPR NR  HBsAg Neg  Hep C NR   HIV NR  GC neg  Chlamydia neg  Genetic screening cfDNA negative   1 hour GTT 122  3 hour GTT N/a   GBS Neg    CT Angio Chest PE W and/or Wo Contrast  Result Date: 10/02/2021 CLINICAL DATA:  Shortness of breath.  C-section on Friday. EXAM: CT ANGIOGRAPHY CHEST WITH CONTRAST TECHNIQUE: Multidetector CT imaging of the chest was performed using the standard protocol during bolus administration of intravenous contrast. Multiplanar CT image reconstructions and MIPs were obtained to evaluate the vascular anatomy. RADIATION DOSE REDUCTION: This exam was performed according to the departmental dose-optimization program which includes automated exposure control, adjustment of the mA and/or kV according to patient size and/or use of iterative reconstruction technique. CONTRAST:  53mL OMNIPAQUE IOHEXOL 350 MG/ML SOLN COMPARISON:  Plain film earlier today FINDINGS: Cardiovascular: The quality of this exam for evaluation of pulmonary embolism is good. No evidence of pulmonary embolism. Normal aortic caliber. Mild cardiomegaly, without pericardial effusion. Mediastinum/Nodes: No mediastinal or hilar adenopathy. Lungs/Pleura: Small bilateral pleural effusions, larger on the right.  Mild smooth septal thickening in the right lower lobe. Diffuse, right greater than left patchy ground-glass opacities. Some areas, including the right middle lobe on 63/6, are somewhat more nodular. Upper Abdomen: Normal imaged portions of the liver, spleen, stomach, pancreas, adrenal glands, kidneys. Musculoskeletal: No acute osseous abnormality. Review of the MIP images confirms the above findings. IMPRESSION: 1.  No evidence of pulmonary embolism. 2. Small bilateral pleural effusions. 3. Heterogeneous, minimally nodular diffuse ground-glass opacity with mild smooth septal thickening in the right lung base. Favor mild infection, including atypical  etiologies. A component of this appearance could also represent pulmonary edema. Electronically Signed   By: Jeronimo Greaves M.D.   On: 10/02/2021 12:27   DG Chest 2 View  Result Date: 10/02/2021 CLINICAL DATA:  Shortness of breath increased since Monday, history hypertension, recent C-section Friday EXAM: CHEST - 2 VIEW COMPARISON:  None FINDINGS: Enlargement of cardiac silhouette with slight vascular congestion consistent with recent postpartum state. Mediastinal contours and pulmonary vascularity normal. Mild atelectasis versus infiltrate at RIGHT base. Remaining lungs clear. No pleural effusion or pneumothorax. IMPRESSION: Mild atelectasis versus infiltrate RIGHT base. Electronically Signed   By: Ulyses Southward M.D.   On: 10/02/2021 11:19    Assessment:  Huntley Demedeiros is a 26 y.o. G43P1001 female with postpartum preeclampsia with severe features.   Plan:  1. Admit to Labor & Delivery; consents reviewed and obtained - Covid admission screen pending - Discussed plan of care with TJS  2. Routine postpartum care  - Continue routine postpartum medications  - Lovenox VTE prophylaxis  3. Postpartum Preeclampsia  - Bedrest with bedside commode privileges  - SCD and TED for VTE prophylaxis  - Start 4 gram Magnesium sulfate IV bolus  - 2 gram Magnesium sulfate  infusion for maintenance for 24 hours  - Continue hypertensive protocol for treatment of severe range blood pressures  - Total IV fluids 75 ml/hr  - Repeat labs in 6 hours  - keep total fluids <25107ml daily - hospitalist mgmt: Lasix  BID, Amlodipine  - low sodium diet    Randa Ngo, CNM Certified Nurse Midwife [facility]

## 2021-10-02 NOTE — Assessment & Plan Note (Signed)
Patient is morbidly obese with a BMI of 48.75 kg/m2 Lifestyle modification and exercise has been discussed with her in detail Complicates overall prognosis and care

## 2021-10-02 NOTE — Assessment & Plan Note (Addendum)
Unclear etiology Probably diastolic dysfunction related to hypertensive heart disease ??  Postpartum cardiomyopathy We will obtain 2D echocardiogram to assess LVEF Start patient on low-dose Lasix Patient is not breast-feeding Optimize blood pressure control We will request cardiology consult

## 2021-10-02 NOTE — Progress Notes (Signed)
Patient ID: Victoria Sutton, female   DOB: Mar 15, 1996, 26 y.o.   MRN: [id number] Notes reviewed  Admission for atypical preeclamsia in PP state. MAgnesium to be started .Monitor BP  to keep < 160/ 110, maximally < 140/90 . Record all urine output . Limit to PO/ IV intake < 2500cc/ 24 hrs  Given possibility of mild pulmonary edema , 1 dose of lasix given  Possible infiltrate  and possible CHF . Cardiology involved for PP cardiomyopathy .  Pt with significant depression and she is remain on Zoloft 150 mg qd and buspar 7.5 mg bid

## 2021-10-02 NOTE — Consult Note (Cosign Needed)
[facility] CLINIC CARDIOLOGY CONSULT NOTE       Patient ID: Victoria Sutton MRN: [id number] DOB/AGE: [birth year] 25 y.o.  Admit date: 10/02/2021 Referring Physician Dr. Joylene Igo Primary Physician Dr. Wallene Huh Primary Cardiologist none Reason for Consultation shortness of breath, ?new CHF  HPI: Victoria Sutton is a 25yoF with a PMH of obesity, depression, prediabetes, elevated blood pressure during third trimester pregnancy, who presents to [facility] ED 10/02/2021 6 days after C-section 09/26/21 with a few days of worsening shortness of breath and bilateral lower extremity swelling.  Cardiology is consulted for possible new onset heart failure.  She presents with her husband who contributes to the history.  She said she had a few elevated blood pressure readings during her pregnancy and was started on aspirin. She notes some lower extremity swelling after her C-section that had been progressively worsening over the past 3 days.  After her C-section she had some elevated blood pressures, but they resolved before she was discharged.  She said on Monday she had some increased anxiety with associated chest tightness and shortness of breath, weakness and no appetite.  On Tuesday she had some intermittent chest heaviness she thought was related to her breasts, and yesterday she became very short of breath with just walking to the bathroom.  Overnight she said when she laid down she was unable to breathe and her leg swelling is getting much worse.    She does note she was brought a lot of fast food when she was in the hospital after delivery.  She currently thinks her leg swelling is little bit better since yesterday afternoon, currently has a mild headache, but no other complaints.  She denies any fever or chills, no significant vaginal bleeding, no discharge or redness around her cesarean incision.  She was pumping breastmilk for a couple days, but does not plan to continue breast-feeding.  Has a family history of heart attack,  pacemaker placement, and heart failure and her maternal grand father.  She has never been seen by a cardiologist in the past.  Vitals are notable for high blood pressure of 171/93 during interview, heart rate 62, but mostly in the 50s on telemetry in sinus rhythm.  Labs are notable for potassium of 4.0, creatinine 0.88, GFR greater than 60.  AST ALT mildly elevated at 55/82.  Slight leukocytosis to 11.5, H&H 9.0/20.5, was 10.5 on 6/2.  High-sensitivity troponin minimally elevated and flat trending at 31-27.  BNP pending  Chest x-ray with enlargement of the cardiac silhouette with slight vascular congestion consistent with recent postpartum state, mild atelectasis versus infiltrate at right base.  CT scan without evidence of pulmonary embolus, small bilateral pleural effusions, heterogenous minimally nodular diffuse groundglass opacities with smooth septal thickening favor infection versus pulmonary edema.   Review of systems complete and found to be negative unless listed above     Past Medical History:  Diagnosis Date   Depression    Diabetes mellitus without complication [facility])     Past Surgical History:  Procedure Laterality Date   CESAREAN SECTION  09/26/2021   Procedure: CESAREAN SECTION;  Surgeon: Schermerhorn, Ihor Austin, MD;  Location: [facility] ORS;  Service: Obstetrics;;   NO PAST SURGERIES      (Not in a hospital admission)  Social History   Socioeconomic History   Marital status: Married    Spouse name: Victoria Sutton   Number of children: Not on file   Years of education: Not on file   Highest education level: Not on file  Occupational History   Not on file  Tobacco Use   Smoking status: Some Days    Types: Cigarettes   Smokeless tobacco: Never  Vaping Use   Vaping Use: Never used  Substance and Sexual Activity   Alcohol use: No   Drug use: No   Sexual activity: Yes  Other Topics Concern   Not on file  Social History Narrative   Not on file   Social Determinants of  Health   Financial Resource Strain: Not on file  Food Insecurity: Not on file  Transportation Needs: Not on file  Physical Activity: Not on file  Stress: Not on file  Social Connections: Not on file  Intimate Partner Violence: Not on file    Family History  Problem Relation Age of Onset   Diabetes Mother    Hypertension Mother    Diabetes Father    Hyperlipidemia Father    Hypertension Father       PHYSICAL EXAM General: Pleasant conversational Caucasian female, well nourished, in no acute distress.  Sitting upright in ED stretcher with husband at bedside HEENT:  Normocephalic and atraumatic. Neck:  No JVD.  Lungs: Normal respiratory effort on room air. Clear bilaterally to auscultation. No wheezes, crackles, rhonchi.  Heart: Bradycardic but regular. Normal S1 and S2 without gallops or murmurs.  Right radial pulse 2+. Abdomen: Obese appearing transverse cesarean section incision without obvious erythema, purulence covered with honeycomb foam dressing.   Msk: Normal strength and tone for age. Extremities: Warm and well perfused. No clubbing, cyanosis.  Bilateral pedal but nonpitting edema.  Neuro: Alert and oriented X 3. Psych:  Answers questions appropriately.   Labs:   Lab Results  Component Value Date   WBC 11.5 (H) 10/02/2021   HGB 9.0 (L) 10/02/2021   HCT 28.5 (L) 10/02/2021   MCV 85.6 10/02/2021   PLT 327 10/02/2021    Recent Labs  Lab 10/02/21 1040 10/02/21 1046  NA 141  --   K 4.0  --   CL 109  --   CO2 23  --   BUN 18  --   CREATININE 0.88  --   CALCIUM 8.9  --   PROT  --  6.3*  BILITOT  --  0.4  ALKPHOS  --  108  ALT  --  82*  AST  --  55*  GLUCOSE 97  --    No results found for: "CKTOTAL", "CKMB", "CKMBINDEX", "TROPONINI" No results found for: "CHOL" No results found for: "HDL" No results found for: "LDLCALC" No results found for: "TRIG" No results found for: "CHOLHDL" No results found for: "LDLDIRECT"    Radiology: CT Angio Chest PE W  and/or Wo Contrast  Result Date: 10/02/2021 CLINICAL DATA:  Shortness of breath.  C-section on Friday. EXAM: CT ANGIOGRAPHY CHEST WITH CONTRAST TECHNIQUE: Multidetector CT imaging of the chest was performed using the standard protocol during bolus administration of intravenous contrast. Multiplanar CT image reconstructions and MIPs were obtained to evaluate the vascular anatomy. RADIATION DOSE REDUCTION: This exam was performed according to the departmental dose-optimization program which includes automated exposure control, adjustment of the mA and/or kV according to patient size and/or use of iterative reconstruction technique. CONTRAST:  72mL OMNIPAQUE IOHEXOL 350 MG/ML SOLN COMPARISON:  Plain film earlier today FINDINGS: Cardiovascular: The quality of this exam for evaluation of pulmonary embolism is good. No evidence of pulmonary embolism. Normal aortic caliber. Mild cardiomegaly, without pericardial effusion. Mediastinum/Nodes: No mediastinal or hilar adenopathy. Lungs/Pleura: Small bilateral pleural  effusions, larger on the right. Mild smooth septal thickening in the right lower lobe. Diffuse, right greater than left patchy ground-glass opacities. Some areas, including the right middle lobe on 63/6, are somewhat more nodular. Upper Abdomen: Normal imaged portions of the liver, spleen, stomach, pancreas, adrenal glands, kidneys. Musculoskeletal: No acute osseous abnormality. Review of the MIP images confirms the above findings. IMPRESSION: 1.  No evidence of pulmonary embolism. 2. Small bilateral pleural effusions. 3. Heterogeneous, minimally nodular diffuse ground-glass opacity with mild smooth septal thickening in the right lung base. Favor mild infection, including atypical etiologies. A component of this appearance could also represent pulmonary edema. Electronically Signed   By: Jeronimo GreavesKyle  Talbot M.D.   On: 10/02/2021 12:27   DG Chest 2 View  Result Date: 10/02/2021 CLINICAL DATA:  Shortness of breath  increased since Monday, history hypertension, recent C-section Friday EXAM: CHEST - 2 VIEW COMPARISON:  None FINDINGS: Enlargement of cardiac silhouette with slight vascular congestion consistent with recent postpartum state. Mediastinal contours and pulmonary vascularity normal. Mild atelectasis versus infiltrate at RIGHT base. Remaining lungs clear. No pleural effusion or pneumothorax. IMPRESSION: Mild atelectasis versus infiltrate RIGHT base. Electronically Signed   By: Ulyses SouthwardMark  Boles M.D.   On: 10/02/2021 11:19    ECHO no prior available for review  TELEMETRY reviewed by me: Sinus bradycardia rate high 40s to 50s  EKG reviewed by me: Sinus bradycardia rate 54  ASSESSMENT AND PLAN:  Victoria Sutton is a 25yoF with a PMH of obesity, depression, prediabetes, elevated blood pressure during third trimester pregnancy, who [facility] 10/02/2021 6 days after C-section 09/26/21 with a few days of worsening shortness of breath and bilateral lower extremity swelling.  Cardiology is consulted for possible new onset heart failure.  #Shortness of breath, lower extremity edema #Hypertension She presents with lower extremity swelling that started post C-section, but his progressively worsened over the past 3 days with associated shortness of breath.  She did have some elevated blood pressures in her third trimester and after delivery, but  at improved before discharge on 6/4.  She admits to lower extremity swelling and orthopnea, chest x-ray with enlargement of cardiac silhouette with slight vascular congestion.  Troponin minimally elevated and flat trending, likely consistent with demand and not ACS.  No further plans to breast-feed.  Query hypertension related versus postpartum cardiomyopathy. -Agree with current therapy -Check BNP -Careful use of IV fluids if needed -We will give IV Lasix 20 mg twice daily -Obtain echocardiogram complete -Beta blocker not recommended with ongoing bradycardia, recommend  hydralazine for BP control or now -Strict monitoring of I's and O's -Further recommendations based on echo results  This patient's plan of care was discussed and created with Dr. Gwen PoundsKowalski and he is in agreement.  Signed: Rebeca AllegraLily Michelle Tang , PA-C 10/02/2021, 2:23 PM [facility]  I had a long discussion with the patient who was comfortable after intravenous Lasix and urine output with better blood pressure control with magnesium infusion as well as amlodipine.  Further evaluation of echocardiogram and medication management thereafter  The patient has been interviewed and examined. I agree with assessment and plan above. Arnoldo HookerBruce Hind Chesler MD [facility]

## 2021-10-02 NOTE — Assessment & Plan Note (Signed)
Patient is status post C-section on 09/26/21 with delivery of a live fetus Chart review shows she had hypertension during her third trimester and postdelivery. Patient states she was monitored in labor and delivery and her blood pressure was within normal limits upon discharge We will start patient on amlodipine for blood pressure control

## 2021-10-02 NOTE — ED Triage Notes (Signed)
Pt had c-section on Friday and since Monday having increased SOB, HTN 157/117, generalized edema. Pt is pale in color, G1P1, denies any heavy bleeding.

## 2021-10-02 NOTE — Consult Note (Addendum)
Initial Consultation Note   Patient: Victoria Sutton OVF:[id number] DOB: 1996-04-13 PCP: Doristine Section, MD DOA: 10/02/2021 DOS: the patient was seen and examined on 10/02/2021 Primary service: Minna Antis, MD  Referring physician: Dr [name] Reason for consult: Possible CHF  Assessment/Plan:  CHF (congestive heart failure) (HCC) Unclear etiology Probably diastolic dysfunction related to hypertensive heart disease ??  Postpartum cardiomyopathy We will obtain 2D echocardiogram to assess LVEF Start patient on low-dose Lasix Patient is not breast-feeding Optimize blood pressure control We will request cardiology consult  Essential hypertension Patient is status post C-section on 09/26/21 with delivery of a live fetus Chart review shows she had hypertension during her third trimester and postdelivery. Patient states she was monitored in labor and delivery and her blood pressure was within normal limits upon discharge We will start patient on amlodipine for blood pressure control  Morbid obesity with BMI of 45.0-49.9, adult (HCC) Patient is morbidly obese with a BMI of 48.75 kg/m2 Lifestyle modification and exercise has been discussed with her in detail Complicates overall prognosis and care  Anxiety and depression Continue buspirone and sertraline       TRH will continue to follow the patient.  HPI: Victoria Sutton is a 26 y.o. female with past medical history significant for obesity, hypertension during pregnancy who presents to the ER 6 days after cesarean section for evaluation of a 2-day history of of exertional shortness of breath associated with bilateral lower extremity swelling. Patient states that postdelivery she developed bilateral lower extremity swelling that has progressively worsened over the several days.  She also complains of exertional shortness of breath.  She denies having any chest pain, no cough, no fever, no chills, no headache, no urinary symptoms, no  blurred vision no focal deficit. She has issues with her blood pressure in her third trimester and notes that after delivery her blood pressure was elevated and she was monitored closely in the labor and delivery unit prior to discharge.   She had a CT angiogram to rule out PE which showed no evidence of pulmonary embolism. Small bilateral pleural effusions. Heterogeneous, minimally nodular diffuse ground-glass opacity with mild smooth septal thickening in the right lung base. Favor mild infection, including atypical etiologies. A component of this appearance could also represent pulmonary edema.  Review of Systems: As mentioned in the history of present illness. All other systems reviewed and are negative. Past Medical History:  Diagnosis Date   Depression    Diabetes mellitus without complication [facility])    Past Surgical History:  Procedure Laterality Date   CESAREAN SECTION  09/26/2021   Procedure: CESAREAN SECTION;  Surgeon: Schermerhorn, Ihor Austin, MD;  Location: [facility] ORS;  Service: Obstetrics;;   NO PAST SURGERIES     Social History:  reports that she has been smoking cigarettes. She has never used smokeless tobacco. She reports that she does not drink alcohol and does not use drugs.  No Known Allergies  Family History  Problem Relation Age of Onset   Diabetes Mother    Hypertension Mother    Diabetes Father    Hyperlipidemia Father    Hypertension Father     Prior to Admission medications   Medication Sig Start Date End Date Taking? Authorizing Provider  acetaminophen (TYLENOL) 500 MG tablet Take 2 tablets (1,000 mg total) by mouth every 6 (six) hours as needed. 09/28/21  Yes Gustavo Lah, CNM  busPIRone (BUSPAR) 7.5 MG tablet Take 7.5 mg by mouth 2 (two) times daily.   Yes [provider]  ferrous sulfate 325 (65 FE) MG tablet Take 1 tablet (325 mg total) by mouth daily. 09/28/21 09/28/22 Yes Gustavo Lah, CNM  ibuprofen (ADVIL) 600 MG tablet Take 1 tablet (600 mg total)  by mouth every 6 (six) hours as needed for mild pain or cramping. 09/28/21  Yes Gustavo Lah, CNM  ondansetron (ZOFRAN) 8 MG tablet Take by mouth every 8 (eight) hours as needed for nausea or vomiting.   Yes [provider]  sertraline (ZOLOFT) 100 MG tablet Take 150 mg by mouth daily.   Yes [provider]  oxyCODONE (OXY IR/ROXICODONE) 5 MG immediate release tablet Take 1 tablet (5 mg total) by mouth every 4 (four) hours as needed for up to 7 days for moderate pain. Patient not taking: Reported on 10/02/2021 09/28/21 10/05/21  Gustavo Lah, CNM  escitalopram (LEXAPRO) 5 MG tablet Take 15 mg by mouth daily.  03/01/20  [provider]  norethindrone-ethinyl estradiol-iron (ESTROSTEP FE,TILIA FE,TRI-LEGEST FE) 1-20/1-30/1-35 MG-MCG tablet Take 1 tablet by mouth daily.  03/01/20  [provider]    Physical Exam: Vitals:   10/02/21 1100 10/02/21 1115 10/02/21 1232 10/02/21 1300  BP: (!) 153/76 (!) 168/81 (!) 149/69 (!) 178/80  Pulse: (!) 52 (!) 53 (!) 57 (!) 50  Resp: (!) 22 16 (!) 21 (!) 22  Temp:      TempSrc:      SpO2: 95% 97% 97% 97%   Physical Exam Vitals and nursing note reviewed.  Constitutional:      Appearance: She is obese.  HENT:     Head: Normocephalic and atraumatic.     Mouth/Throat:     Mouth: Mucous membranes are moist.  Eyes:     Pupils: Pupils are equal, round, and reactive to light.  Cardiovascular:     Rate and Rhythm: Bradycardia present.  Pulmonary:     Effort: Tachypnea present.     Breath sounds: Examination of the right-lower field reveals rales. Examination of the left-lower field reveals rales. Rales present.  Abdominal:     General: Bowel sounds are normal.     Palpations: Abdomen is soft.  Musculoskeletal:     Cervical back: Normal range of motion and neck supple.     Right lower leg: Edema present.     Left lower leg: Edema present.  Skin:    General: Skin is warm and dry.  Neurological:     General: No focal  deficit present.     Mental Status: She is alert.  Psychiatric:        Mood and Affect: Mood normal.        Behavior: Behavior normal.     Data Reviewed:  Relevant notes from primary care and specialist visits, past discharge summaries as available in EHR, including Care Everywhere. Prior diagnostic testing as pertinent to current admission diagnoses Updated medications and problem lists for reconciliation ED course, including vitals, labs, imaging, treatment and response to treatment Triage notes, nursing and pharmacy notes and ED provider's notes Notable results as noted in HPI Labs reviewed.  White count 11.5, hemoglobin 9.0, hematocrit 28.5, platelet count 327, PT 13.6, INR 1.1, sodium 141, potassium 4.0, chloride 109, bicarb 23, glucose 97, BUN 18, creatinine 0.88, calcium 8.9, troponin 31 >> 21 Chest x-ray reviewed by me shows enlargement of cardiac silhouette with slight vascular congestion consistent with recent postpartum state. CT angiogram shows no evidence of pulmonary embolism. Small bilateral pleural effusions. Heterogeneous, minimally nodular diffuse ground-glass opacity  with mild smooth septal thickening in the right lung base. Favor mild infection, including atypical etiologies. A component of this appearance could also represent pulmonary edema. Twelve-lead EKG reviewed by me shows sinus bradycardia.  Short PR interval There are no new results to review at this time.    Family Communication: Greater than 50% of time was spent discussing patient's condition and plan of care with her at the bedside.  All questions and concerns have been addressed.  They verbalized understanding and agree with the plan. Primary team communication:  Thank you very much for involving us in the care of your patient.  Author: Lucile Shuttersochukwu Glenora Morocho, MD 10/02/2021 2:07 PM  For on call review [email or web address].

## 2021-10-03 ENCOUNTER — Inpatient Hospital Stay
Admit: 2021-10-03 | Discharge: 2021-10-03 | Disposition: A | Discharge status: Home / Self Care | Payer: 59 | Attending: Cardiology | Admitting: Cardiology

## 2021-10-03 DIAGNOSIS — O1495 Unspecified pre-eclampsia, complicating the puerperium: Secondary | ICD-10-CM

## 2021-10-03 LAB — ECHOCARDIOGRAM COMPLETE
AR max vel: 2.74 cm2
AV Area VTI: 3.74 cm2
AV Area mean vel: 3.18 cm2
AV Mean grad: 4 mmHg
AV Peak grad: 8.9 mmHg
Ao pk vel: 1.49 m/s
Area-P 1/2: 3.48 cm2
Height: 64 in
MV VTI: 4 cm2
S' Lateral: 2.4 cm
Weight: 4464 oz

## 2021-10-03 LAB — COMPREHENSIVE METABOLIC PANEL
ALT: 90 U/L — ABNORMAL HIGH (ref 0–44)
AST: 52 U/L — ABNORMAL HIGH (ref 15–41)
Albumin: 3 g/dL — ABNORMAL LOW (ref 3.5–5.0)
Alkaline Phosphatase: 114 U/L (ref 38–126)
Anion gap: 9 (ref 5–15)
BUN: 16 mg/dL (ref 6–20)
CO2: 27 mmol/L (ref 22–32)
Calcium: 7.6 mg/dL — ABNORMAL LOW (ref 8.9–10.3)
Chloride: 104 mmol/L (ref 98–111)
Creatinine, Ser: 0.96 mg/dL (ref 0.44–1.00)
GFR, Estimated: >60 mL/min (ref >60)
Glucose, Bld: 90 mg/dL (ref 70–99)
Potassium: 3.8 mmol/L (ref 3.5–5.1)
Sodium: 140 mmol/L (ref 135–145)
Total Bilirubin: 0.4 mg/dL (ref 0.3–1.2)
Total Protein: 7.3 g/dL (ref 6.5–8.1)

## 2021-10-03 LAB — CBC WITH DIFFERENTIAL/PLATELET
Abs Immature Granulocytes: 0.06 10*3/uL (ref 0.00–0.07)
Basophils Absolute: 0 10*3/uL (ref 0.0–0.1)
Basophils Relative: 0 %
Eosinophils Absolute: 0.2 10*3/uL (ref 0.0–0.5)
Eosinophils Relative: 2 %
HCT: 31.2 % — ABNORMAL LOW (ref 36.0–46.0)
Hemoglobin: 10.4 g/dL — ABNORMAL LOW (ref 12.0–15.0)
Immature Granulocytes: 1 %
Lymphocytes Relative: 13 %
Lymphs Abs: 1.3 10*3/uL (ref 0.7–4.0)
MCH: 27.2 pg (ref 26.0–34.0)
MCHC: 33.3 g/dL (ref 30.0–36.0)
MCV: 81.5 fL (ref 80.0–100.0)
Monocytes Absolute: 0.7 10*3/uL (ref 0.1–1.0)
Monocytes Relative: 7 %
Neutro Abs: 7.8 10*3/uL — ABNORMAL HIGH (ref 1.7–7.7)
Neutrophils Relative %: 77 %
Platelets: 356 10*3/uL (ref 150–400)
RBC: 3.83 MIL/uL — ABNORMAL LOW (ref 3.87–5.11)
RDW: 14.2 % (ref 11.5–15.5)
WBC: 10.1 10*3/uL (ref 4.0–10.5)
nRBC: 0 % (ref 0.0–0.2)

## 2021-10-03 LAB — PHOSPHORUS: Phosphorus: 5 mg/dL — ABNORMAL HIGH (ref 2.5–4.6)

## 2021-10-03 LAB — MAGNESIUM: Magnesium: 6.4 mg/dL (ref 1.7–2.4)

## 2021-10-03 MED ORDER — FUROSEMIDE 20 MG PO TABS
20.0000 mg | ORAL_TABLET | Freq: Every day | ORAL | Status: DC
Start: 2021-10-04 — End: 2021-10-05
  Administered 2021-10-04 – 2021-10-05 (×2): 20 mg via ORAL
  Filled 2021-10-03 (×2): qty 1

## 2021-10-03 NOTE — Progress Notes (Signed)
*  PRELIMINARY RESULTS* Echocardiogram 2D Echocardiogram has been performed.  Victoria Sutton 10/03/2021, 7:43 AM

## 2021-10-03 NOTE — Progress Notes (Signed)
[facility] CLINIC CARDIOLOGY CONSULT NOTE       Patient ID: Victoria Sutton MRN: [id number] DOB/AGE: 06/18/1995 26 y.o.  Admit date: 10/02/2021 Referring Physician Dr. Joylene Igo Primary Physician Dr. Wallene Huh Primary Cardiologist none Reason for Consultation shortness of breath, ?new CHF  HPI: Victoria Sutton is a 25yoF with a PMH of obesity, depression, prediabetes, elevated blood pressure during third trimester pregnancy, who presents to [facility] ED 10/02/2021 6 days after C-section 09/26/21 with a few days of worsening shortness of breath and bilateral lower extremity swelling.  Cardiology is consulted for possible new onset heart failure.  Interval History: -diuresed well with net negative 7L  -shortness of breath improved, feels little crummy on IV mag but overall feeling better -BP much improved and HR in the 80s -echo resulted with normal LVEF, diastolic parameters normal limits, mild-mod TR   Review of systems complete and found to be negative unless listed above     Past Medical History:  Diagnosis Date   Depression    Diabetes mellitus without complication [facility])     Past Surgical History:  Procedure Laterality Date   CESAREAN SECTION  09/26/2021   Procedure: CESAREAN SECTION;  Surgeon: Schermerhorn, Ihor Austin, MD;  Location: [facility] ORS;  Service: Obstetrics;;   NO PAST SURGERIES      Medications Prior to Admission  Medication Sig Dispense Refill Last Dose   acetaminophen (TYLENOL) 500 MG tablet Take 2 tablets (1,000 mg total) by mouth every 6 (six) hours as needed. 30 tablet 0 10/01/2021   busPIRone (BUSPAR) 7.5 MG tablet Take 7.5 mg by mouth 2 (two) times daily.   10/02/2021 at 0745   ferrous sulfate 325 (65 FE) MG tablet Take 1 tablet (325 mg total) by mouth daily. 30 tablet 3 10/01/2021   ibuprofen (ADVIL) 600 MG tablet Take 1 tablet (600 mg total) by mouth every 6 (six) hours as needed for mild pain or cramping. 60 tablet 1 10/01/2021   ondansetron (ZOFRAN) 8 MG tablet Take by mouth every 8  (eight) hours as needed for nausea or vomiting.   10/01/2021   sertraline (ZOLOFT) 100 MG tablet Take 150 mg by mouth daily.   10/02/2021 at 0745   oxyCODONE (OXY IR/ROXICODONE) 5 MG immediate release tablet Take 1 tablet (5 mg total) by mouth every 4 (four) hours as needed for up to 7 days for moderate pain. (Patient not taking: Reported on 10/02/2021) 20 tablet 0 Not Taking    Social History   Socioeconomic History   Marital status: Married    Spouse name: [place]   Number of children: Not on file   Years of education: Not on file   Highest education level: Not on file  Occupational History   Not on file  Tobacco Use   Smoking status: Some Days    Types: Cigarettes   Smokeless tobacco: Never  Vaping Use   Vaping Use: Never used  Substance and Sexual Activity   Alcohol use: No   Drug use: No   Sexual activity: Yes  Other Topics Concern   Not on file  Social History Narrative   Not on file   Social Determinants of Health   Financial Resource Strain: Not on file  Food Insecurity: Not on file  Transportation Needs: Not on file  Physical Activity: Not on file  Stress: Not on file  Social Connections: Not on file  Intimate Partner Violence: Not on file    Family History  Problem Relation Age of Onset   Diabetes Mother  Hypertension Mother    Diabetes Father    Hyperlipidemia Father    Hypertension Father       PHYSICAL EXAM General: Pleasant Caucasian female, well nourished, in no acute distress.  Sitting upright in hospital bed, mother at bedside HEENT:  Normocephalic and atraumatic. Neck:  No JVD.  Lungs: Normal respiratory effort on room air. Clear bilaterally to auscultation. No wheezes, crackles, rhonchi.  Heart: regular rate and rhythm  Normal S1 and S2 without gallops or murmurs.  Right radial pulse 2+. Abdomen: Obese appearing transverse cesarean section incision without obvious erythema, purulence covered with honeycomb foam dressing.   Msk: Normal strength  and tone for age. Extremities: Warm and well perfused. No clubbing, cyanosis.  Trace bilateral LE Edema Neuro: Alert and oriented X 3. Psych:  Answers questions appropriately.   Labs:   Lab Results  Component Value Date   WBC 10.1 10/03/2021   HGB 10.4 (L) 10/03/2021   HCT 31.2 (L) 10/03/2021   MCV 81.5 10/03/2021   PLT 356 10/03/2021    Recent Labs  Lab 10/03/21 0657  NA 140  K 3.8  CL 104  CO2 27  BUN 16  CREATININE 0.96  CALCIUM 7.6*  PROT 7.3  BILITOT 0.4  ALKPHOS 114  ALT 90*  AST 52*  GLUCOSE 90    No results found for: "CKTOTAL", "CKMB", "CKMBINDEX", "TROPONINI" No results found for: "CHOL" No results found for: "HDL" No results found for: "LDLCALC" No results found for: "TRIG" No results found for: "CHOLHDL" No results found for: "LDLDIRECT"    Radiology: ECHOCARDIOGRAM COMPLETE  Result Date: 10/03/2021    ECHOCARDIOGRAM REPORT   Patient Name:   Victoria Sutton Date of Exam: 10/03/2021 Medical Rec #:  [id number]     Height:       64.0 in Accession #:    [id number]    Weight:       279.0 lb Date of Birth:  [birth year]      BSA:          2.254 m Patient Age:    25 years      BP:           Not listed in chart/Not listed in                                             chart mmHg Patient Gender: F             HR:           78 bpm. Exam Location:  [facility] Procedure: 2D Echo, Cardiac Doppler and Color Doppler Indications:     Elevated troponin  History:         Patient has no prior history of Echocardiogram examinations.                  Risk Factors:Diabetes.  Sonographer:     Cristela Blue Referring Phys:  [id number] [facility] MICHELLE Hussain Maimone Diagnosing Phys: Adrian Blackwater  Sonographer Comments: Suboptimal apical window and no subcostal window. IMPRESSIONS  1. Left ventricular ejection fraction, by estimation, is 60 to 65%. The left ventricle has normal function. The left ventricle has no regional wall motion abnormalities. Left ventricular diastolic parameters were normal.  2. Right ventricular  systolic function is normal. The right ventricular size is normal.  3. The mitral valve is normal in structure. No evidence of  mitral valve regurgitation. No evidence of mitral stenosis.  4. Tricuspid valve regurgitation is mild to moderate.  5. The aortic valve is normal in structure. Aortic valve regurgitation is not visualized. No aortic stenosis is present.  6. The inferior vena cava is normal in size with greater than 50% respiratory variability, suggesting right atrial pressure of 3 mmHg. FINDINGS  Left Ventricle: Left ventricular ejection fraction, by estimation, is 60 to 65%. The left ventricle has normal function. The left ventricle has no regional wall motion abnormalities. The left ventricular internal cavity size was normal in size. There is  no left ventricular hypertrophy. Left ventricular diastolic parameters were normal. Right Ventricle: The right ventricular size is normal. No increase in right ventricular wall thickness. Right ventricular systolic function is normal. Left Atrium: Left atrial size was normal in size. Right Atrium: Right atrial size was normal in size. Pericardium: There is no evidence of pericardial effusion. Mitral Valve: The mitral valve is normal in structure. No evidence of mitral valve regurgitation. No evidence of mitral valve stenosis. MV peak gradient, 4.2 mmHg. The mean mitral valve gradient is 2.0 mmHg. Tricuspid Valve: The tricuspid valve is normal in structure. Tricuspid valve regurgitation is mild to moderate. No evidence of tricuspid stenosis. Aortic Valve: The aortic valve is normal in structure. Aortic valve regurgitation is not visualized. No aortic stenosis is present. Aortic valve mean gradient measures 4.0 mmHg. Aortic valve peak gradient measures 8.9 mmHg. Aortic valve area, by VTI measures 3.74 cm. Pulmonic Valve: The pulmonic valve was normal in structure. Pulmonic valve regurgitation is not visualized. No evidence of pulmonic stenosis. Aorta: The aortic root  is normal in size and structure. Venous: The inferior vena cava is normal in size with greater than 50% respiratory variability, suggesting right atrial pressure of 3 mmHg. IAS/Shunts: No atrial level shunt detected by color flow Doppler.  LEFT VENTRICLE PLAX 2D LVIDd:         4.40 cm   Diastology LVIDs:         2.40 cm   LV e' medial:    11.50 cm/s LV PW:         1.10 cm   LV E/e' medial:  9.0 LV IVS:        1.00 cm   LV e' lateral:   10.00 cm/s LVOT diam:     2.10 cm   LV E/e' lateral: 10.3 LV SV:         75 LV SV Index:   33 LVOT Area:     3.46 cm  RIGHT VENTRICLE RV Basal diam:  4.80 cm RV S prime:     15.80 cm/s TAPSE (M-mode): 2.1 cm LEFT ATRIUM           Index        RIGHT ATRIUM           Index LA diam:      3.50 cm 1.55 cm/m   RA Area:     16.10 cm LA Vol (A4C): 37.8 ml 16.77 ml/m  RA Volume:   40.40 ml  17.93 ml/m  AORTIC VALVE                    PULMONIC VALVE AV Area (Vmax):    2.74 cm     PV Vmax:        1.06 m/s AV Area (Vmean):   3.18 cm     PV Vmean:       76.300 cm/s AV Area (  VTI):     3.74 cm     PV VTI:         0.220 m AV Vmax:           149.00 cm/s  PV Peak grad:   4.5 mmHg AV Vmean:          94.100 cm/s  PV Mean grad:   3.0 mmHg AV VTI:            0.200 m      RVOT Peak grad: 7 mmHg AV Peak Grad:      8.9 mmHg AV Mean Grad:      4.0 mmHg LVOT Vmax:         118.00 cm/s LVOT Vmean:        86.500 cm/s LVOT VTI:          0.216 m LVOT/AV VTI ratio: 1.08  AORTA Ao Root diam: 2.83 cm MITRAL VALVE                TRICUSPID VALVE MV Area (PHT): 3.48 cm     TR Peak grad:   7.5 mmHg MV Area VTI:   4.00 cm     TR Vmax:        137.00 cm/s MV Peak grad:  4.2 mmHg MV Mean grad:  2.0 mmHg     SHUNTS MV Vmax:       1.02 m/s     Systemic VTI:  0.22 m MV Vmean:      64.6 cm/s    Systemic Diam: 2.10 cm MV Decel Time: 218 msec     Pulmonic VTI:  0.267 m MV E velocity: 103.00 cm/s MV A velocity: 66.00 cm/s MV E/A ratio:  1.56 Shaukat Khan Electronically signed by Adrian BlackwaterShaukat Khan Signature Date/Time:  10/03/2021/10:20:09 AM    Final    CT Angio Chest PE W and/or Wo Contrast  Result Date: 10/02/2021 CLINICAL DATA:  Shortness of breath.  C-section on Friday. EXAM: CT ANGIOGRAPHY CHEST WITH CONTRAST TECHNIQUE: Multidetector CT imaging of the chest was performed using the standard protocol during bolus administration of intravenous contrast. Multiplanar CT image reconstructions and MIPs were obtained to evaluate the vascular anatomy. RADIATION DOSE REDUCTION: This exam was performed according to the departmental dose-optimization program which includes automated exposure control, adjustment of the mA and/or kV according to patient size and/or use of iterative reconstruction technique. CONTRAST:  75mL OMNIPAQUE IOHEXOL 350 MG/ML SOLN COMPARISON:  Plain film earlier today FINDINGS: Cardiovascular: The quality of this exam for evaluation of pulmonary embolism is good. No evidence of pulmonary embolism. Normal aortic caliber. Mild cardiomegaly, without pericardial effusion. Mediastinum/Nodes: No mediastinal or hilar adenopathy. Lungs/Pleura: Small bilateral pleural effusions, larger on the right. Mild smooth septal thickening in the right lower lobe. Diffuse, right greater than left patchy ground-glass opacities. Some areas, including the right middle lobe on 63/6, are somewhat more nodular. Upper Abdomen: Normal imaged portions of the liver, spleen, stomach, pancreas, adrenal glands, kidneys. Musculoskeletal: No acute osseous abnormality. Review of the MIP images confirms the above findings. IMPRESSION: 1.  No evidence of pulmonary embolism. 2. Small bilateral pleural effusions. 3. Heterogeneous, minimally nodular diffuse ground-glass opacity with mild smooth septal thickening in the right lung base. Favor mild infection, including atypical etiologies. A component of this appearance could also represent pulmonary edema. Electronically Signed   By: Jeronimo GreavesKyle  Talbot M.D.   On: 10/02/2021 12:27   DG Chest 2 View  Result  Date: 10/02/2021 CLINICAL DATA:  Shortness of breath  increased since Monday, history hypertension, recent C-section Friday EXAM: CHEST - 2 VIEW COMPARISON:  None FINDINGS: Enlargement of cardiac silhouette with slight vascular congestion consistent with recent postpartum state. Mediastinal contours and pulmonary vascularity normal. Mild atelectasis versus infiltrate at RIGHT base. Remaining lungs clear. No pleural effusion or pneumothorax. IMPRESSION: Mild atelectasis versus infiltrate RIGHT base. Electronically Signed   By: Ulyses Southward M.D.   On: 10/02/2021 11:19    ECHO no prior available for review  TELEMETRY reviewed by me: Sinus bradycardia rate high 40s to 50s  EKG reviewed by me: Sinus bradycardia rate 54  ASSESSMENT AND PLAN:  Glynda Soliday is a 25yoF with a PMH of obesity, depression, prediabetes, elevated blood pressure during third trimester pregnancy, who presents to [facility] ED 10/02/2021 6 days after C-section 09/26/21 with a few days of worsening shortness of breath and bilateral lower extremity swelling.  Cardiology is consulted for possible new onset heart failure.  #Shortness of breath, lower extremity edema #atypical preeclampsia in the postpartum period She presents with lower extremity swelling that started post C-section, but his progressively worsened over the past 3 days with associated shortness of breath.  She did have some elevated blood pressures in her third trimester and after delivery, but  at improved before discharge on 6/4.  She admits to lower extremity swelling and orthopnea, chest x-ray with enlargement of cardiac silhouette with slight vascular congestion.  Troponin minimally elevated and flat trending, likely consistent with demand and not ACS.  No further plans to breast-feed.  Query postpartum cardiomyopathy vs volume overload/pulmonary edema post c-section, favor volume related -Agree with current therapy -BNP elevated at 700 -Careful use of IV fluids  -continue IV  Lasix 20 mg twice daily while on LR, please discharge on  PO lasix daily  -HR and BP much improved. Ok for amlodipine   -Strict monitoring of I's and O's -no further recommendations from a cardiac standpoint, can follow up in office with Dr. Gwen Pounds in 2-3 weeks.   Please contact Dr. Adrian Blackwater for questions through the weekend.   This patient's plan of care was discussed and created with Dr. Gwen Pounds and he is in agreement.  Signed: Rebeca Allegra , PA-C 10/03/2021, 10:28 AM [facility] Cardiology

## 2021-10-03 NOTE — Consult Note (Signed)
Victoria Sutton  [id number] DOB: 1995/12/22 DOA: 10/02/2021 PCP: Tillie Fantasia, MD    Brief Narrative:  26 year old with a history of obesity and HTN during pregnancy who presented to the ER 6 days following cesarean section with a 2-day history of severe exertional shortness of breath and bilateral lower extremity edema which had progressively worsened over that time.  She denied chest pain cough fevers or chills.  In the ER CTa chest was negative for pulmonary embolism but did reveal small bilateral pleural effusions.  There were findings suggestive of possible atypical infiltrates versus pulmonary edema also noted on the CT.  Interim Hx: Cardiology is addressing the patient's edema, and concern for cardiomyopathy/CHF. OB/GYN is addressing her HTN.   TRH will sign off as all active issues are currently being expertly managed by other more appropriate services.   Please feel free to re-consult [place] should there be any way we can add to the care of this patient.   Recommendations / Assessment & Plan:  Pulmonary edema - peripheral edema Likely diastolic dysfunction in setting of uncontrolled hypertension during third trimester versus postpartum cardiomyopathy - Cardiology following this - TTE pending   HTN - atypical preeclampsia in PP state First appreciated during third trimester but has persisted postdelivery  Morbid obesity - Body mass index is 47.89 kg/m.  Anxiety/depression Continue Zoloft  Objective: Blood pressure 139/79, pulse 86, temperature 98.5 F (36.9 C), temperature source Oral, resp. rate (!) 33, height 5\' 4"  (1.626 m), weight 126.6 kg, last menstrual period 11/02/2020, SpO2 96 %, unknown if currently breastfeeding.  Intake/Output Summary (Last 24 hours) at 10/03/2021 0959 Last data filed at 10/03/2021 0900 Gross per 24 hour  Intake 1808.66 ml  Output 9105 ml  Net -7296.34 ml   Filed Weights   10/02/21 1745  Weight: 126.6 kg   CBC: Recent Labs  Lab 10/02/21 1040  10/02/21 1803 10/03/21 0657  WBC 11.5* 11.4* 10.1  NEUTROABS  --   --  7.8*  HGB 9.0* 10.7* 10.4*  HCT 28.5* 33.1* 31.2*  MCV 85.6 83.8 81.5  PLT 327 365 [id number]   Basic Metabolic Panel: Recent Labs  Lab 10/02/21 1040 10/02/21 1803 10/03/21 0657  NA 141 140 140  K 4.0 3.6 3.8  CL 109 106 104  CO2 23 25 27   GLUCOSE 97 91 90  BUN 18 17 16   CREATININE 0.88 0.99 0.96  CALCIUM 8.9 9.1 7.6*  MG  --   --  6.4*  PHOS  --   --  5.0*   GFR: Estimated Creatinine Clearance: 118.1 mL/min (by C-G formula based on SCr of 0.96 mg/dL).  Liver Function Tests: Recent Labs  Lab 09/26/21 1033 10/02/21 1046 10/02/21 1803 10/03/21 0657  AST 17 55* 63* 52*  ALT 18 82* 100* 90*  ALKPHOS 121 108 127* 114  BILITOT 0.4 0.4 0.7 0.4  PROT 7.1 6.3* 7.5 7.3  ALBUMIN 3.0* 2.7* 3.2* 3.0*    Scheduled Meds:  amLODipine  5 mg Oral Daily   busPIRone  7.5 mg Oral BID   enoxaparin (LOVENOX) injection  0.5 mg/kg Subcutaneous Q24H   ferrous sulfate  325 mg Oral BID WC   furosemide  20 mg Intravenous BID   prenatal multivitamin  1 tablet Oral Q1200   senna-docusate  2 tablet Oral Q24H   sertraline  150 mg Oral Daily   simethicone  80 mg Oral TID PC   Continuous Infusions:  lactated ringers 25 mL/hr at 10/02/21 1539   magnesium sulfate  2 g/hr (10/02/21 1546)     LOS: 1 day   Cherene Altes, MD [facility] Office  [phone] Pager - Text Page per Amion  If 7PM-7AM, please contact night-coverage per Amion 10/03/2021, 9:59 AM

## 2021-10-03 NOTE — Progress Notes (Signed)
Postpartum Day  6 - readmit day 1  Subjective: no complaints, tolerating PO, + flatus, and feeling much better   Doing well, no concerns. Bedrest, pain managed with PO meds, tolerating regular diet. Foley remains in situ, draining large amount of clear urine.   No fever/chills, chest pain, shortness of breath, nausea/vomiting, or leg pain. No nipple or breast pain. No headache, visual changes, or RUQ/epigastric pain.  Objective: BP 139/79   Pulse 86   Temp 98.5 F (36.9 C) (Oral)   Resp (!) 33   Ht 5\' 4"  (1.626 m)   Wt 126.6 kg   LMP 11/02/2020 (Approximate)   SpO2 96%   Breastfeeding Unknown   BMI 47.89 kg/m   Vitals:   10/02/21 2235 10/02/21 2335 10/03/21 0035 10/03/21 0135  BP: (!) 147/80 137/78 (!) 144/82 124/75   10/03/21 0233 10/03/21 0334 10/03/21 0438 10/03/21 0541  BP: 136/79 130/74 135/73 (!) 141/76   10/03/21 0635 10/03/21 1050 10/03/21 1415 10/03/21 1535  BP: 139/79 121/78 118/63 125/78     Physical Exam:  General: alert, cooperative, and no distress Breasts: soft/nontender CV: RRR Pulm: nl effort, CTABL Abdomen: soft, non-tender, active bowel sounds Uterine Fundus: firm Incision: healing well, no significant drainage, covered with occlusive OP site Perineum: minimal edema, intact Lochia: appropriate DVT Evaluation: No evidence of DVT seen on physical exam.  Recent Labs    10/02/21 1803 10/03/21 0657  HGB 10.7* 10.4*  HCT 33.1* 31.2*  WBC 11.4* 10.1  PLT 365 356      Latest Ref Rng & Units 10/03/2021    6:57 AM 10/02/2021    6:03 PM 10/02/2021   10:46 AM  CMP  Glucose 70 - 99 mg/dL 90  91    BUN 6 - 20 mg/dL 16  17    Creatinine 12/02/2021 - 1.00 mg/dL 4.09  8.11    Sodium 9.14 - 145 mmol/L 140  140    Potassium 3.5 - 5.1 mmol/L 3.8  3.6    Chloride 98 - 111 mmol/L 104  106    CO2 22 - 32 mmol/L 27  25    Calcium 8.9 - 10.3 mg/dL 7.6  9.1    Total Protein 6.5 - 8.1 g/dL 7.3  7.5  6.3   Total Bilirubin 0.3 - 1.2 mg/dL 0.4  0.7  0.4   Alkaline Phos 38  - 126 U/L 114  127  108   AST 15 - 41 U/L 52  63  55   ALT 0 - 44 U/L 90  100  82    Total I/O In: 1544.8 [P.O.:1000; I.V.:544.8] Out: 985 [Urine:985]   Assessment/Plan: 26 y.o. G1P1001 postpartum day # 6, readmit day 1  -Continue routine postpartum care -Dr. 22 updated on labs and postpartum course  -Encouraged snug fitting bra, cold application, Tylenol PRN, and cabbage leaves for engorgement for formula feeding  -No severe range b/p's - normal to mild range  -Respiratory sx of significantly improved  -Urine output adequate  -Seen by cardiology and Echo completed this morning  -Sx likely related to fluid/volume excess versus postpartum cardiomyopathy and/or CHF -Will continue mag sulfate for 24 hours then transfer to MB  Disposition: Continue inpatient postpartum care    LOS: 1 day   Jean Rosenthal, CNM 10/03/2021, 9:38 AM   ----- 12/03/2021  Certified Nurse Midwife [facility]

## 2021-10-04 LAB — CBC
HCT: 33.4 % — ABNORMAL LOW (ref 36.0–46.0)
Hemoglobin: 10.8 g/dL — ABNORMAL LOW (ref 12.0–15.0)
MCH: 26.5 pg (ref 26.0–34.0)
MCHC: 32.3 g/dL (ref 30.0–36.0)
MCV: 82.1 fL (ref 80.0–100.0)
Platelets: 391 10*3/uL (ref 150–400)
RBC: 4.07 MIL/uL (ref 3.87–5.11)
RDW: 14.3 % (ref 11.5–15.5)
WBC: 9.5 10*3/uL (ref 4.0–10.5)
nRBC: 0 % (ref 0.0–0.2)

## 2021-10-04 LAB — COMPREHENSIVE METABOLIC PANEL
ALT: 65 U/L — ABNORMAL HIGH (ref 0–44)
AST: 22 U/L (ref 15–41)
Albumin: 2.8 g/dL — ABNORMAL LOW (ref 3.5–5.0)
Alkaline Phosphatase: 112 U/L (ref 38–126)
Anion gap: 7 (ref 5–15)
BUN: 19 mg/dL (ref 6–20)
CO2: 26 mmol/L (ref 22–32)
Calcium: 7.8 mg/dL — ABNORMAL LOW (ref 8.9–10.3)
Chloride: 106 mmol/L (ref 98–111)
Creatinine, Ser: 0.79 mg/dL (ref 0.44–1.00)
GFR, Estimated: >60 mL/min (ref >60)
Glucose, Bld: 86 mg/dL (ref 70–99)
Potassium: 3.7 mmol/L (ref 3.5–5.1)
Sodium: 139 mmol/L (ref 135–145)
Total Bilirubin: 0.2 mg/dL — ABNORMAL LOW (ref 0.3–1.2)
Total Protein: 6.9 g/dL (ref 6.5–8.1)

## 2021-10-04 NOTE — Progress Notes (Signed)
Postpartum Day  8 - readmit day 2  Subjective: no complaints, tolerating PO, + flatus, and feeling much better   Doing well, no concerns. Tolerating ambulation, pain managed with PO meds, tolerating regular diet. Foley remains in situ, draining large amount of clear urine.   No fever/chills, chest pain, shortness of breath, nausea/vomiting, or leg pain. No nipple or breast pain. No headache, visual changes, or RUQ/epigastric pain.  Objective: BP 129/82 (BP Location: Left Arm)   Pulse 65   Temp 98.1 F (36.7 C) (Oral)   Resp 20   Ht 5\' 4"  (1.626 m)   Wt 126.6 kg   LMP 11/02/2020 (Approximate)   SpO2 98%   Breastfeeding Unknown   BMI 47.89 kg/m   Vitals:   10/03/21 0541 10/03/21 0635 10/03/21 1050 10/03/21 1415  BP: (!) 141/76 139/79 121/78 118/63   10/03/21 1535 10/03/21 1646 10/03/21 1744 10/03/21 1847  BP: 125/78 121/68 114/70 110/66   10/03/21 1934 10/03/21 2246 10/04/21 0411 10/04/21 0817  BP: 116/71 119/63 119/68 129/82     Physical Exam:  General: alert, cooperative, and no distress Breasts: soft/nontender CV: RRR Pulm: nl effort Abdomen: soft, non-tender Incision: healing well, no significant drainage, covered with occlusive OP site Lochia: appropriate DVT Evaluation: No evidence of DVT seen on physical exam.  Recent Labs    10/03/21 0657 10/04/21 0537  HGB 10.4* 10.8*  HCT 31.2* 33.4*  WBC 10.1 9.5  PLT 356 391      Latest Ref Rng & Units 10/04/2021    5:37 AM 10/03/2021    6:57 AM 10/02/2021    6:03 PM  CMP  Glucose 70 - 99 mg/dL 86  90  91   BUN 6 - 20 mg/dL 19  16  17    Creatinine 0.44 - 1.00 mg/dL 12/02/2021   4.65   Sodium 135 - 145 mmol/L 139  140  140   Potassium 3.5 - 5.1 mmol/L 3.7  3.8  3.6   Chloride 98 - 111 mmol/L 106  104  106   CO2 22 - 32 mmol/L 26  27  25    Calcium 8.9 - 10.3 mg/dL 7.8  7.6  9.1   Total Protein 6.5 - 8.1 g/dL 6.9  7.3  7.5   Total Bilirubin 0.3 - 1.2 mg/dL 0.2  0.4  0.7   Alkaline Phos 38 - 126 U/L 112  114  127    AST 15 - 41 U/L 22  52  63   ALT 0 - 44 U/L 65  90  100    Total I/O In: 240 [P.O.:240] Out: -    Assessment/Plan: 26 y.o. G1P1001 postpartum day # 8, readmit day 2 for PP Pre-e with SFs  -Dr. 2.75 updated on labs and hospital course  -BP  See BPs above  Continue Norvasc 5mg  QD and Lasix 20mg  QD  Will have hospitalist check in today -Respiratory sx significantly improved   Pt will continue incentive spirometer  Will encourage ambulation today -Urine output adequate   1901-0700 1,133mL  Will continue total fluids at <2518mL/day  Will d/c foley and transition to hat  Swelling improved 2+ edema in the legs none in her feet -Seen by cardiology and cleared -Mag sulfate completed yesterday 1417  Disposition: Continue inpatient postpartum care    LOS: 2 days   Jean Rosenthal, CNM 10/04/2021, 9:40 AM

## 2021-10-05 LAB — CBC WITH DIFFERENTIAL/PLATELET
Abs Immature Granulocytes: 0.03 10*3/uL (ref 0.00–0.07)
Basophils Absolute: 0 10*3/uL (ref 0.0–0.1)
Basophils Relative: 1 %
Eosinophils Absolute: 0.2 10*3/uL (ref 0.0–0.5)
Eosinophils Relative: 2 %
HCT: 34.2 % — ABNORMAL LOW (ref 36.0–46.0)
Hemoglobin: 11 g/dL — ABNORMAL LOW (ref 12.0–15.0)
Immature Granulocytes: 0 %
Lymphocytes Relative: 23 %
Lymphs Abs: 2 10*3/uL (ref 0.7–4.0)
MCH: 26.4 pg (ref 26.0–34.0)
MCHC: 32.2 g/dL (ref 30.0–36.0)
MCV: 82.2 fL (ref 80.0–100.0)
Monocytes Absolute: 0.6 10*3/uL (ref 0.1–1.0)
Monocytes Relative: 7 %
Neutro Abs: 5.9 10*3/uL (ref 1.7–7.7)
Neutrophils Relative %: 67 %
Platelets: 403 10*3/uL — ABNORMAL HIGH (ref 150–400)
RBC: 4.16 MIL/uL (ref 3.87–5.11)
RDW: 14.5 % (ref 11.5–15.5)
WBC: 8.7 10*3/uL (ref 4.0–10.5)
nRBC: 0 % (ref 0.0–0.2)

## 2021-10-05 LAB — COMPREHENSIVE METABOLIC PANEL
ALT: 51 U/L — ABNORMAL HIGH (ref 0–44)
AST: 21 U/L (ref 15–41)
Albumin: 3 g/dL — ABNORMAL LOW (ref 3.5–5.0)
Alkaline Phosphatase: 97 U/L (ref 38–126)
Anion gap: 6 (ref 5–15)
BUN: 20 mg/dL (ref 6–20)
CO2: 23 mmol/L (ref 22–32)
Calcium: 8.9 mg/dL (ref 8.9–10.3)
Chloride: 110 mmol/L (ref 98–111)
Creatinine, Ser: 0.8 mg/dL (ref 0.44–1.00)
GFR, Estimated: >60 mL/min (ref >60)
Glucose, Bld: 89 mg/dL (ref 70–99)
Potassium: 4.5 mmol/L (ref 3.5–5.1)
Sodium: 139 mmol/L (ref 135–145)
Total Bilirubin: 0.2 mg/dL — ABNORMAL LOW (ref 0.3–1.2)
Total Protein: 7 g/dL (ref 6.5–8.1)

## 2021-10-05 MED ORDER — AMLODIPINE BESYLATE 5 MG PO TABS
5.0000 mg | ORAL_TABLET | Freq: Every day | ORAL | 3 refills | Status: AC
Start: 2021-10-06 — End: 2022-02-03

## 2021-10-05 MED ORDER — FUROSEMIDE 20 MG PO TABS: 20.0000 mg | ORAL_TABLET | Freq: Every day | ORAL | 0 refills | Status: AC

## 2021-10-05 NOTE — Discharge Summary (Signed)
Patient ID: Victoria Sutton MRN: [id number] DOB/AGE: [birth year] 26 y.o.  Admit date: 10/02/2021 Discharge date: 10/05/2021  Admission Diagnoses: 26yo G1P1 presents on PPD 6 days with c/o SOB, swelling, and a HA. She was admitted for PP pre-eclampsia with severe features.  Discharge Diagnoses: Improved SOB, stable BPs and labs, improved swelling  Procedures:  24 hours of Mag Sulfate  Consults: Cardiology, Hospitalist  Significant Diagnostic Studies:  Results for orders placed or performed during the hospital encounter of 10/02/21 (from the past 168 hour(s))  Basic metabolic panel   Collection Time: 10/02/21 10:40 AM  Result Value Ref Range   Sodium 141 135 - 145 mmol/L   Potassium 4.0 3.5 - 5.1 mmol/L   Chloride 109 98 - 111 mmol/L   CO2 23 22 - 32 mmol/L   Glucose, Bld 97 70 - 99 mg/dL   BUN 18 6 - 20 mg/dL   Creatinine, Ser 6.16 0.44 - 1.00 mg/dL   Calcium 8.9 8.9 - 07.3 mg/dL   GFR, Estimated >71 >06 mL/min   Anion gap 9 5 - 15  CBC   Collection Time: 10/02/21 10:40 AM  Result Value Ref Range   WBC 11.5 (H) 4.0 - 10.5 K/uL   RBC 3.33 (L) 3.87 - 5.11 MIL/uL   Hemoglobin 9.0 (L) 12.0 - 15.0 g/dL   HCT 26.9 (L) 48.5 - 46.2 %   MCV 85.6 80.0 - 100.0 fL   MCH 27.0 26.0 - 34.0 pg   MCHC 31.6 30.0 - 36.0 g/dL   RDW 70.3 50.0 - 93.8 %   Platelets 327 150 - 400 K/uL   nRBC 0.2 0.0 - 0.2 %  Protime-INR (order if Patient is taking Coumadin / Warfarin)   Collection Time: 10/02/21 10:40 AM  Result Value Ref Range   Prothrombin Time 13.6 11.4 - 15.2 seconds   INR 1.1 0.8 - 1.2  Sample to Blood Bank   Collection Time: 10/02/21 10:40 AM  Result Value Ref Range   Blood Bank Specimen SAMPLE AVAILABLE FOR TESTING    Sample Expiration      10/05/2021,2359 Performed at [facility], [address] [place], [place] [address]   Troponin I (High Sensitivity)   Collection Time: 10/02/21 10:40 AM  Result Value Ref Range   Troponin I (High Sensitivity) 31 (H) <18 ng/L  Hepatic  function panel   Collection Time: 10/02/21 10:46 AM  Result Value Ref Range   Total Protein 6.3 (L) 6.5 - 8.1 g/dL   Albumin 2.7 (L) 3.5 - 5.0 g/dL   AST 55 (H) 15 - 41 U/L   ALT 82 (H) 0 - 44 U/L   Alkaline Phosphatase 108 38 - 126 U/L   Total Bilirubin 0.4 0.3 - 1.2 mg/dL   Bilirubin, Direct 0.1 0.0 - 0.2 mg/dL   Indirect Bilirubin 0.3 0.3 - 0.9 mg/dL  Urinalysis, Complete w Microscopic   Collection Time: 10/02/21 11:41 AM  Result Value Ref Range   Color, Urine YELLOW (A) YELLOW   APPearance HAZY (A) CLEAR   Specific Gravity, Urine 1.042 (H) 1.005 - 1.030   pH 6.0 5.0 - 8.0   Glucose, UA NEGATIVE NEGATIVE mg/dL   Hgb urine dipstick LARGE (A) NEGATIVE   Bilirubin Urine NEGATIVE NEGATIVE   Ketones, ur NEGATIVE NEGATIVE mg/dL   Protein, ur NEGATIVE NEGATIVE mg/dL   Nitrite NEGATIVE NEGATIVE   Leukocytes,Ua SMALL (A) NEGATIVE   RBC / HPF 0-5 0 - 5 RBC/hpf   WBC, UA 21-50 0 - 5 WBC/hpf  Bacteria, UA RARE (A) NONE SEEN   Squamous Epithelial / LPF 0-5 0 - 5   Mucus PRESENT   Troponin I (High Sensitivity)   Collection Time: 10/02/21 12:40 PM  Result Value Ref Range   Troponin I (High Sensitivity) 27 (H) <18 ng/L  Brain natriuretic peptide   Collection Time: 10/02/21  3:52 PM  Result Value Ref Range   B Natriuretic Peptide 712.1 (H) 0.0 - 100.0 pg/mL  Protein / creatinine ratio, urine   Collection Time: 10/02/21  3:55 PM  Result Value Ref Range   Creatinine, Urine 76 mg/dL   Total Protein, Urine 40 mg/dL   Protein Creatinine Ratio 0.53 (H) 0.00 - 0.15 mg/mg[Cre]  Comprehensive metabolic panel   Collection Time: 10/02/21  6:03 PM  Result Value Ref Range   Sodium 140 135 - 145 mmol/L   Potassium 3.6 3.5 - 5.1 mmol/L   Chloride 106 98 - 111 mmol/L   CO2 25 22 - 32 mmol/L   Glucose, Bld 91 70 - 99 mg/dL   BUN 17 6 - 20 mg/dL   Creatinine, Ser 1.610.99 0.44 - 1.00 mg/dL   Calcium 9.1 8.9 - 09.610.3 mg/dL   Total Protein 7.5 6.5 - 8.1 g/dL   Albumin 3.2 (L) 3.5 - 5.0 g/dL   AST  63 (H) 15 - 41 U/L   ALT 100 (H) 0 - 44 U/L   Alkaline Phosphatase 127 (H) 38 - 126 U/L   Total Bilirubin 0.7 0.3 - 1.2 mg/dL   GFR, Estimated >04>60 >54>60 mL/min   Anion gap 9 5 - 15  CBC   Collection Time: 10/02/21  6:03 PM  Result Value Ref Range   WBC 11.4 (H) 4.0 - 10.5 K/uL   RBC 3.95 3.87 - 5.11 MIL/uL   Hemoglobin 10.7 (L) 12.0 - 15.0 g/dL   HCT 09.833.1 (L) 11.936.0 - 14.746.0 %   MCV 83.8 80.0 - 100.0 fL   MCH 27.1 26.0 - 34.0 pg   MCHC 32.3 30.0 - 36.0 g/dL   RDW 82.914.6 56.211.5 - 13.015.5 %   Platelets 365 150 - 400 K/uL   nRBC 0.2 0.0 - 0.2 %  Comprehensive metabolic panel   Collection Time: 10/03/21  6:57 AM  Result Value Ref Range   Sodium 140 135 - 145 mmol/L   Potassium 3.8 3.5 - 5.1 mmol/L   Chloride 104 98 - 111 mmol/L   CO2 27 22 - 32 mmol/L   Glucose, Bld 90 70 - 99 mg/dL   BUN 16 6 - 20 mg/dL   Creatinine, Ser 8.650.96 0.44 - 1.00 mg/dL   Calcium 7.6 (L) 8.9 - 10.3 mg/dL   Total Protein 7.3 6.5 - 8.1 g/dL   Albumin 3.0 (L) 3.5 - 5.0 g/dL   AST 52 (H) 15 - 41 U/L   ALT 90 (H) 0 - 44 U/L   Alkaline Phosphatase 114 38 - 126 U/L   Total Bilirubin 0.4 0.3 - 1.2 mg/dL   GFR, Estimated >78>60 >46>60 mL/min   Anion gap 9 5 - 15  Magnesium   Collection Time: 10/03/21  6:57 AM  Result Value Ref Range   Magnesium 6.4 (HH) 1.7 - 2.4 mg/dL  Phosphorus   Collection Time: 10/03/21  6:57 AM  Result Value Ref Range   Phosphorus 5.0 (H) 2.5 - 4.6 mg/dL  CBC with Differential/Platelet   Collection Time: 10/03/21  6:57 AM  Result Value Ref Range   WBC 10.1 4.0 - 10.5 K/uL   RBC  3.83 (L) 3.87 - 5.11 MIL/uL   Hemoglobin 10.4 (L) 12.0 - 15.0 g/dL   HCT 62.6 (L) 94.8 - 54.6 %   MCV 81.5 80.0 - 100.0 fL   MCH 27.2 26.0 - 34.0 pg   MCHC 33.3 30.0 - 36.0 g/dL   RDW 27.0 35.0 - 09.3 %   Platelets 356 150 - 400 K/uL   nRBC 0.0 0.0 - 0.2 %   Neutrophils Relative % 77 %   Neutro Abs 7.8 (H) 1.7 - 7.7 K/uL   Lymphocytes Relative 13 %   Lymphs Abs 1.3 0.7 - 4.0 K/uL   Monocytes Relative 7 %    Monocytes Absolute 0.7 0.1 - 1.0 K/uL   Eosinophils Relative 2 %   Eosinophils Absolute 0.2 0.0 - 0.5 K/uL   Basophils Relative 0 %   Basophils Absolute 0.0 0.0 - 0.1 K/uL   Immature Granulocytes 1 %   Abs Immature Granulocytes 0.06 0.00 - 0.07 K/uL  ECHOCARDIOGRAM COMPLETE   Collection Time: 10/03/21  7:43 AM  Result Value Ref Range   Weight 4,464 oz   Height 64 in   BP 139/79 mmHg   Ao pk vel 1.49 m/s   AV Area VTI 3.74 cm2   AR max vel 2.74 cm2   AV Mean grad 4.0 mmHg   AV Peak grad 8.9 mmHg   S' Lateral 2.40 cm   AV Area mean vel 3.18 cm2   Area-P 1/2 3.48 cm2   MV VTI 4.00 cm2  CBC   Collection Time: 10/04/21  5:37 AM  Result Value Ref Range   WBC 9.5 4.0 - 10.5 K/uL   RBC 4.07 3.87 - 5.11 MIL/uL   Hemoglobin 10.8 (L) 12.0 - 15.0 g/dL   HCT 81.8 (L) 29.9 - 37.1 %   MCV 82.1 80.0 - 100.0 fL   MCH 26.5 26.0 - 34.0 pg   MCHC 32.3 30.0 - 36.0 g/dL   RDW 69.6 78.9 - 38.1 %   Platelets 391 150 - 400 K/uL   nRBC 0.0 0.0 - 0.2 %  Comprehensive metabolic panel   Collection Time: 10/04/21  5:37 AM  Result Value Ref Range   Sodium 139 135 - 145 mmol/L   Potassium 3.7 3.5 - 5.1 mmol/L   Chloride 106 98 - 111 mmol/L   CO2 26 22 - 32 mmol/L   Glucose, Bld 86 70 - 99 mg/dL   BUN 19 6 - 20 mg/dL   Creatinine, Ser 0.17 0.44 - 1.00 mg/dL   Calcium 7.8 (L) 8.9 - 10.3 mg/dL   Total Protein 6.9 6.5 - 8.1 g/dL   Albumin 2.8 (L) 3.5 - 5.0 g/dL   AST 22 15 - 41 U/L   ALT 65 (H) 0 - 44 U/L   Alkaline Phosphatase 112 38 - 126 U/L   Total Bilirubin 0.2 (L) 0.3 - 1.2 mg/dL   GFR, Estimated >51 >02 mL/min   Anion gap 7 5 - 15  CBC with Differential/Platelet   Collection Time: 10/05/21 10:08 AM  Result Value Ref Range   WBC 8.7 4.0 - 10.5 K/uL   RBC 4.16 3.87 - 5.11 MIL/uL   Hemoglobin 11.0 (L) 12.0 - 15.0 g/dL   HCT 58.5 (L) 27.7 - 82.4 %   MCV 82.2 80.0 - 100.0 fL   MCH 26.4 26.0 - 34.0 pg   MCHC 32.2 30.0 - 36.0 g/dL   RDW 23.5 36.1 - 44.3 %   Platelets 403 (H) 150 - 400  K/uL  nRBC 0.0 0.0 - 0.2 %   Neutrophils Relative % 67 %   Neutro Abs 5.9 1.7 - 7.7 K/uL   Lymphocytes Relative 23 %   Lymphs Abs 2.0 0.7 - 4.0 K/uL   Monocytes Relative 7 %   Monocytes Absolute 0.6 0.1 - 1.0 K/uL   Eosinophils Relative 2 %   Eosinophils Absolute 0.2 0.0 - 0.5 K/uL   Basophils Relative 1 %   Basophils Absolute 0.0 0.0 - 0.1 K/uL   Immature Granulocytes 0 %   Abs Immature Granulocytes 0.03 0.00 - 0.07 K/uL  Comprehensive metabolic panel   Collection Time: 10/05/21 10:08 AM  Result Value Ref Range   Sodium 139 135 - 145 mmol/L   Potassium 4.5 3.5 - 5.1 mmol/L   Chloride 110 98 - 111 mmol/L   CO2 23 22 - 32 mmol/L   Glucose, Bld 89 70 - 99 mg/dL   BUN 20 6 - 20 mg/dL   Creatinine, Ser 6.04 0.44 - 1.00 mg/dL   Calcium 8.9 8.9 - 54.0 mg/dL   Total Protein 7.0 6.5 - 8.1 g/dL   Albumin 3.0 (L) 3.5 - 5.0 g/dL   AST 21 15 - 41 U/L   ALT 51 (H) 0 - 44 U/L   Alkaline Phosphatase 97 38 - 126 U/L   Total Bilirubin 0.2 (L) 0.3 - 1.2 mg/dL   GFR, Estimated >98 >11 mL/min   Anion gap 6 5 - 15    Treatments: IV hydration, cardiac meds: amlodipine, and anticoagulation: Lovenox  Hospital Course:  Lovenox VTE prophylaxis 24 hour magnesium sulfate infusion Serial labs Fluid restriction of <2550mL daily Lasix  BID and Amlodipine  2D Echocardiogram preformed   amLODipine  5 mg Oral Daily   busPIRone  7.5 mg Oral BID   enoxaparin (LOVENOX) injection  0.5 mg/kg Subcutaneous Q24H   ferrous sulfate  325 mg Oral BID WC   furosemide  20 mg Oral Daily   prenatal multivitamin  1 tablet Oral Q1200   senna-docusate  2 tablet Oral Q24H   sertraline  150 mg Oral Daily   simethicone  80 mg Oral TID PC     Discharge Physical Exam:  BP (!) 104/50 (BP Location: Left Arm)   Pulse (!) 59   Temp 98.2 F (36.8 C)   Resp 18   Ht  (1.626 m)   Wt 126.6 kg   LMP 11/02/2020 (Approximate)   SpO2 99%   Breastfeeding Unknown   BMI 47.89 kg/m   Vitals:   10/03/21 1744  10/03/21 1847 10/03/21 1934 10/03/21 2246  BP: 114/70 110/66 116/71 119/63   10/04/21 0411 10/04/21 0817 10/04/21 1151 10/04/21 1617  BP: 119/68 129/82 (!) 101/54 117/71   10/04/21 2023 10/05/21 0011 10/05/21 0341 10/05/21 0934  BP: 112/76 118/65 105/63 (!) 104/50     General: NAD CV: RRR Pulm: CTABL, nl effort ABD: s/nd/nt, gravid DVT Evaluation: LE non-ttp, no evidence of DVT on exam.     Discharge Condition: Stable  Disposition: Discharge disposition: 01-Home or Self Care        Allergies as of 10/05/2021   No Known Allergies      Medication List     TAKE these medications    acetaminophen 500 MG tablet Commonly known as: TYLENOL Take 2 tablets (1,000 mg total) by mouth every 6 (six) hours as needed.   amLODipine 5 MG tablet Commonly known as: NORVASC Take 1 tablet (5 mg total) by mouth daily. Start taking on: October 06, 2021   busPIRone 7.5 MG tablet Commonly known as: BUSPAR Take 7.5 mg by mouth 2 (two) times daily.   ferrous sulfate 325 (65 FE) MG tablet Take 1 tablet (325 mg total) by mouth daily.   furosemide 20 MG tablet Commonly known as: LASIX Take 1 tablet (20 mg total) by mouth daily for 3 days. Start taking on: October 06, 2021   ibuprofen 600 MG tablet Commonly known as: ADVIL Take 1 tablet (600 mg total) by mouth every 6 (six) hours as needed for mild pain or cramping.   ondansetron 8 MG tablet Commonly known as: ZOFRAN Take by mouth every 8 (eight) hours as needed for nausea or vomiting.   oxyCODONE 5 MG immediate release tablet Commonly known as: Oxy IR/ROXICODONE Take 1 tablet (5 mg total) by mouth every 4 (four) hours as needed for up to 7 days for moderate pain.   sertraline 100 MG tablet Commonly known as: ZOLOFT Take 150 mg by mouth daily.        Follow-up Information     Lamar Blinks, MD. Schedule an appointment as soon as possible for a visit in 2 week(s).   Specialty: Cardiology Why: hospital follow up Contact  information: [address] [facility] [place] [address] [phone]         Schermerhorn, Ihor Austin, MD. Go on 10/07/2021.   Specialty: Obstetrics and Gynecology Why: @ 10:00, Keep all scheduled appointments Contact information: [address] [place] [address] [phone]                 Signed:  Quillian Quince 10/05/2021 11:30 AM

## 2021-10-05 NOTE — Progress Notes (Signed)
Pt given discharge instructions including follow up care, prescriptions and reasons to be seen. VSS. Pt verbalized understanding and all questions were answered. Pt taken via wheelchair by RN to medical mall where he husband was there to pick her up.

## 2021-10-07 DIAGNOSIS — O135 Gestational [pregnancy-induced] hypertension without significant proteinuria, complicating the puerperium: Secondary | ICD-10-CM | POA: Diagnosis not present

## 2021-10-07 DIAGNOSIS — R69 Illness, unspecified: Secondary | ICD-10-CM | POA: Diagnosis not present

## 2021-10-07 DIAGNOSIS — J810 Acute pulmonary edema: Secondary | ICD-10-CM | POA: Diagnosis not present

## 2021-10-09 ENCOUNTER — Encounter: Payer: Self-pay | Admitting: Obstetrics and Gynecology

## 2021-10-09 NOTE — ED Provider Notes (Signed)
[facility] Provider Note    Event Date/Time   First MD Initiated Contact with Patient 10/02/21 1439     (approximate)  History   Chief Complaint: Shortness of Breath and Postpartum Complications  HPI  Victoria Sutton is a 26 y.o. female with a past medical history of diabetes, 6 days status post C-section who presents for worsening shortness of breath, peripheral edema and headache.  Patient denies any history of hypertension previously.  Patient has moderate hypertension in the emergency department around 150 systolic.  Patient denies any chest pain.  Patient does state she has been feeling more weak recently and husband states she appears somewhat more pale.  Continues to have vaginal bleeding although states it is mild.  Physical Exam   Triage Vital Signs: ED Triage Vitals  Enc Vitals Group     BP 10/02/21 1030 (!) 176/88     Pulse Rate 10/02/21 1030 (!) 58     Resp 10/02/21 1030 20     Temp 10/02/21 1030 98.4 F (36.9 C)     Temp Source 10/02/21 1030 Oral     SpO2 10/02/21 1030 96 %     Weight 10/02/21 1745 279 lb (126.6 kg)     Height 10/02/21 1836 5\' 4"  (1.626 m)     Head Circumference --      Peak Flow --      Pain Score 10/02/21 1630 0     Pain Loc --      Pain Edu? --      Excl. in GC? --     Most recent vital signs: Vitals:   10/05/21 0934 10/05/21 1144  BP: (!) 104/50 122/69  Pulse: (!) 59 63  Resp: 18   Temp:  98 F (36.7 C)  SpO2: 99% 100%    General: Awake, no distress.  Somewhat pale appearance. CV:  Good peripheral perfusion.  Regular rate and rhythm  Resp:  Normal effort.  Equal breath sounds bilaterally.  Abd:  No distention.  Soft, nontender.  No rebound or guarding. Other:  Patient does have lower extremity edema bilaterally.  Nontender.   ED Results / Procedures / Treatments   EKG  EKG viewed and interpreted by myself shows a normal sinus rhythm at 54 bpm with a narrow QRS, normal axis, normal intervals, no  concerning ST changes.  RADIOLOGY  I have interpreted the chest x-ray, no obvious abnormality seen on my evaluation. Radiology is read the chest x-ray is mild atelectasis versus developing pneumonia in the right lower lobe. CTA is negative for PE but does show bilateral small pleural effusions as well as concern for mild interstitial edema versus infection.  MEDICATIONS ORDERED IN ED: Medications  calcium gluconate 10 % injection (has no administration in time range)  iohexol (OMNIPAQUE) 350 MG/ML injection 75 mL (75 mLs Intravenous Contrast Given 10/02/21 1203)  magnesium bolus via infusion 4 g (4 g Intravenous Bolus from Bag 10/02/21 1546)  potassium chloride (KLOR-CON M) CR tablet 40 mEq (40 mEq Oral Given 10/02/21 2239)     IMPRESSION / MDM / ASSESSMENT AND PLAN / ED COURSE  I reviewed the triage vital signs and the nursing notes.  Patient's presentation is most consistent with acute presentation with potential threat to life or bodily function.  Patient presents to the emergency department for worsening shortness of breath weakness especially with exertion since delivering status post C-section 6 days ago.  Patient found to be moderately hypertensive.  Patient's LFTs are slightly  elevated.  Urinalysis is normal including no protein.  CBC shows no concerning findings hemoglobin 10.7.  Patient's troponin is mildly elevated at 31.  CTA shows small bilateral pleural effusions with possible interstitial edema.  Given the patient's recent postpartum state hypertension with headache weakness with slightly elevated troponin and a CTA showing pleural effusions and possible interstitial edema the concern would be for cardiomyopathy of pregnancy/CHF.  Spoke to [facility] who will be admitting the patient.  Spoke to the hospitalist for medical consult.  Patient agreeable to plan of care.  FINAL CLINICAL IMPRESSION(S) / ED DIAGNOSES   Cardiomyopathy Fluid overload    Note:  This document was prepared using  Dragon voice recognition software and may include unintentional dictation errors.   Minna Antis, MD 10/09/21 1120

## 2021-11-10 DIAGNOSIS — D225 Melanocytic nevi of trunk: Secondary | ICD-10-CM | POA: Diagnosis not present

## 2021-11-10 DIAGNOSIS — Z30011 Encounter for initial prescription of contraceptive pills: Secondary | ICD-10-CM | POA: Diagnosis not present

## 2022-01-27 DIAGNOSIS — Z0000 Encounter for general adult medical examination without abnormal findings: Secondary | ICD-10-CM | POA: Diagnosis not present

## 2022-01-27 DIAGNOSIS — E559 Vitamin D deficiency, unspecified: Secondary | ICD-10-CM | POA: Diagnosis not present

## 2022-01-27 DIAGNOSIS — F331 Major depressive disorder, recurrent, moderate: Secondary | ICD-10-CM | POA: Diagnosis not present

## 2022-01-27 DIAGNOSIS — I509 Heart failure, unspecified: Secondary | ICD-10-CM | POA: Diagnosis not present

## 2022-01-27 DIAGNOSIS — Z6841 Body Mass Index (BMI) 40.0 and over, adult: Secondary | ICD-10-CM | POA: Diagnosis not present

## 2022-01-27 DIAGNOSIS — F419 Anxiety disorder, unspecified: Secondary | ICD-10-CM | POA: Diagnosis not present

## 2022-01-27 DIAGNOSIS — Z131 Encounter for screening for diabetes mellitus: Secondary | ICD-10-CM | POA: Diagnosis not present

## 2022-01-27 DIAGNOSIS — Z1329 Encounter for screening for other suspected endocrine disorder: Secondary | ICD-10-CM | POA: Diagnosis not present

## 2022-01-27 DIAGNOSIS — I10 Essential (primary) hypertension: Secondary | ICD-10-CM | POA: Diagnosis not present

## 2022-01-27 DIAGNOSIS — Z23 Encounter for immunization: Secondary | ICD-10-CM | POA: Diagnosis not present

## 2022-01-27 DIAGNOSIS — Z13220 Encounter for screening for lipoid disorders: Secondary | ICD-10-CM | POA: Diagnosis not present

## 2022-01-27 DIAGNOSIS — E538 Deficiency of other specified B group vitamins: Secondary | ICD-10-CM | POA: Diagnosis not present

## 2022-01-27 DIAGNOSIS — R69 Illness, unspecified: Secondary | ICD-10-CM | POA: Diagnosis not present

## 2022-03-21 DIAGNOSIS — R000 Tachycardia, unspecified: Secondary | ICD-10-CM | POA: Diagnosis not present

## 2022-03-21 DIAGNOSIS — R42 Dizziness and giddiness: Secondary | ICD-10-CM | POA: Diagnosis not present

## 2022-03-21 DIAGNOSIS — R030 Elevated blood-pressure reading, without diagnosis of hypertension: Secondary | ICD-10-CM | POA: Diagnosis not present

## 2022-03-21 DIAGNOSIS — R110 Nausea: Secondary | ICD-10-CM | POA: Diagnosis not present

## 2022-07-28 IMAGING — CR DG CHEST 2V
1 series · 2 of 2 positions shown · non-contrast
Comparison: None

CLINICAL DATA: Shortness of breath increased since [REDACTED], history
hypertension, recent C-section [REDACTED]

EXAM:
CHEST - 2 VIEW

[Series 1: dg chest 2 view · 0.14mm/px · 2 of 2 slices shown]
[im 1/2]
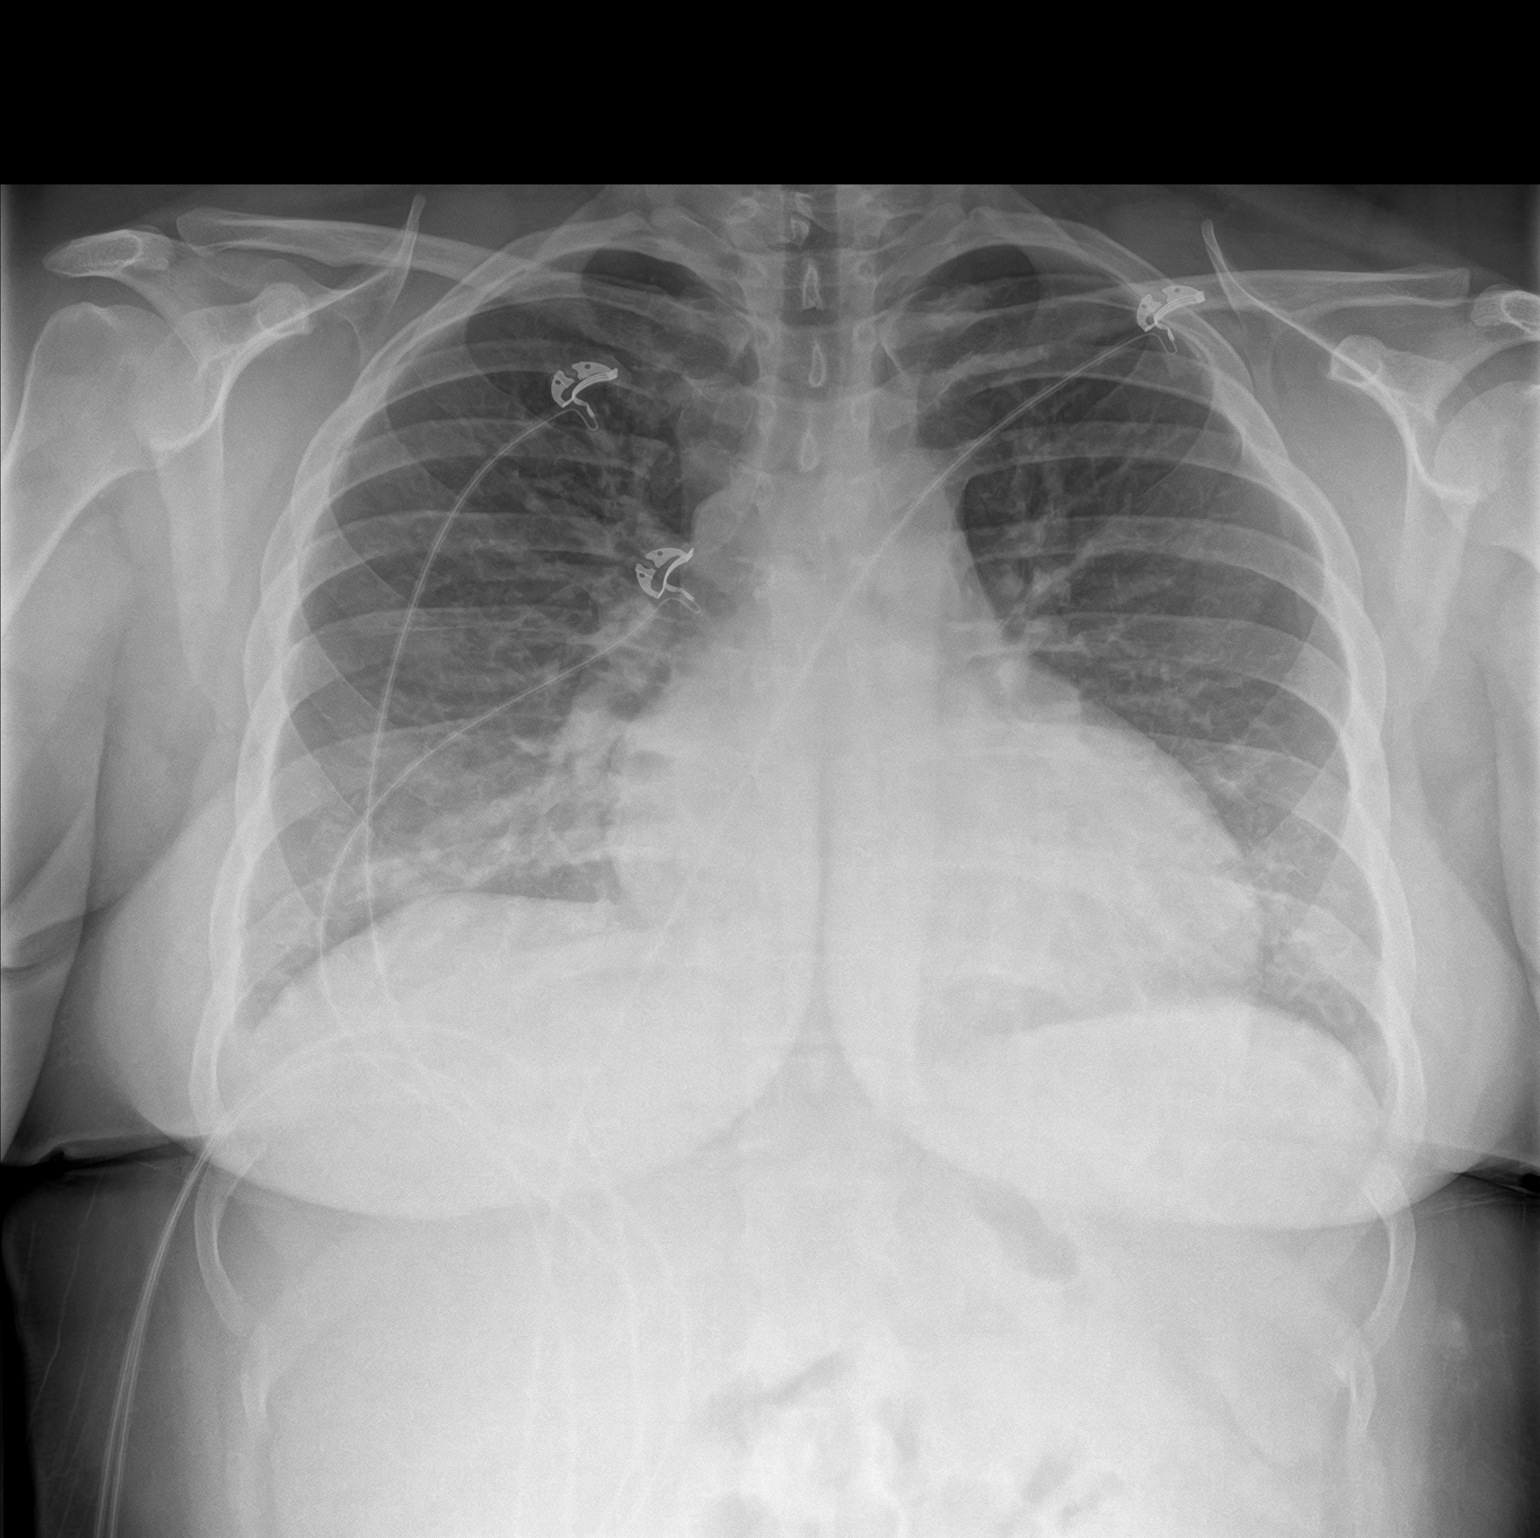
[im 2/2]
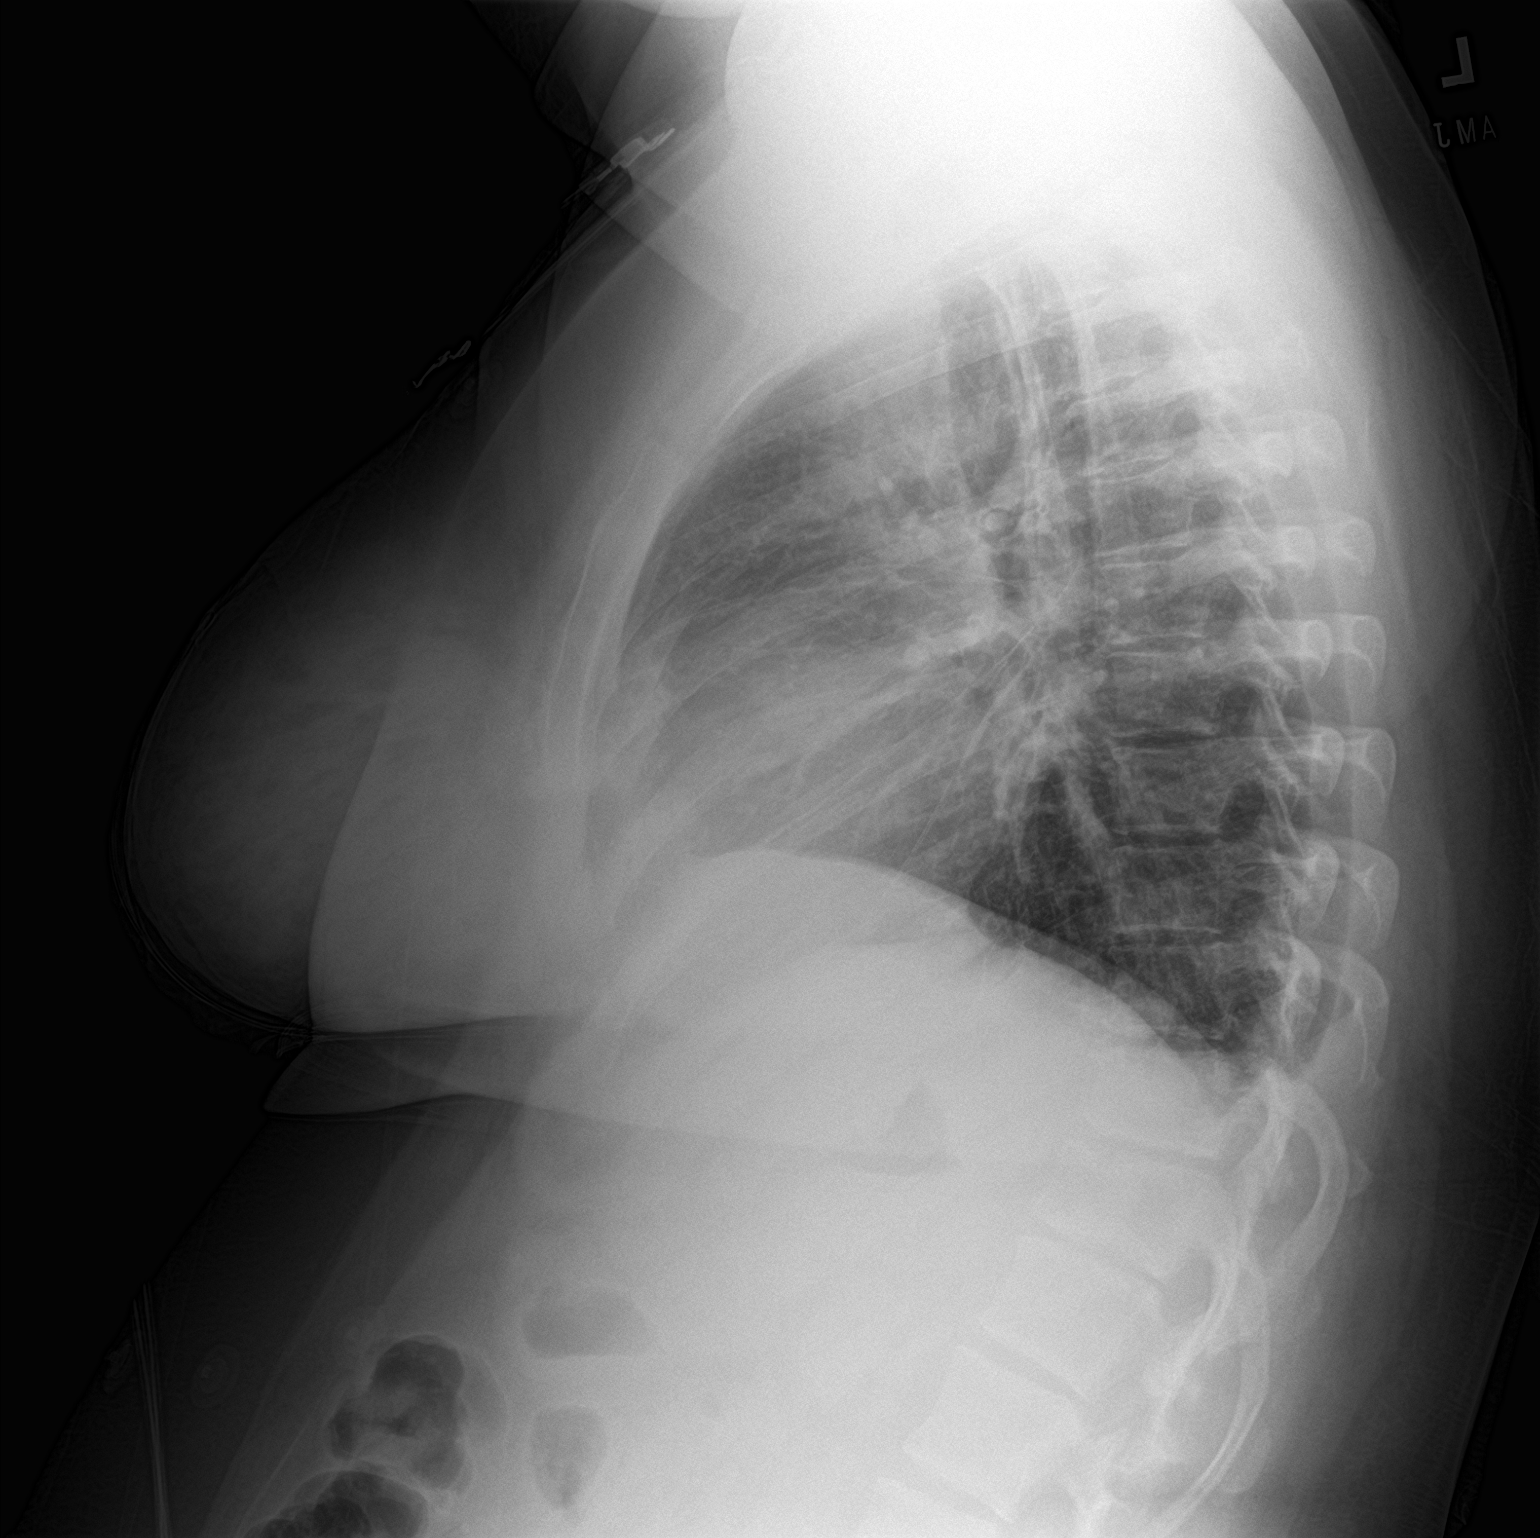

[2 of 2 positions shown; findings below may reference images not displayed]

FINDINGS: Enlargement of cardiac silhouette with slight vascular congestion
consistent with recent postpartum state.

Mediastinal contours and pulmonary vascularity normal.

Mild atelectasis versus infiltrate at RIGHT base.

Remaining lungs clear.

No pleural effusion or pneumothorax.
IMPRESSION: Mild atelectasis versus infiltrate RIGHT base.

## 2023-10-14 ENCOUNTER — Other Ambulatory Visit: Payer: Self-pay | Admitting: Neurology

## 2023-10-14 DIAGNOSIS — R519 Headache, unspecified: Secondary | ICD-10-CM

## 2023-10-14 DIAGNOSIS — H9313 Tinnitus, bilateral: Secondary | ICD-10-CM

## 2023-10-17 ENCOUNTER — Ambulatory Visit
Admission: RE | Admit: 2023-10-17 | Discharge: 2023-10-17 | Disposition: A | Discharge status: Home / Self Care | Source: Ambulatory Visit | Attending: Neurology | Admitting: Neurology

## 2023-10-17 DIAGNOSIS — H9313 Tinnitus, bilateral: Secondary | ICD-10-CM | POA: Insufficient documentation

## 2023-10-17 DIAGNOSIS — R519 Headache, unspecified: Secondary | ICD-10-CM

## 2023-10-17 MED ORDER — GADOBUTROL 1 MMOL/ML IV SOLN
10.0000 mL | Freq: Once | INTRAVENOUS | Status: AC | PRN
  Administered 2023-10-17: 10 mL via INTRAVENOUS
